# Patient Record
Sex: Male | Born: 2000 | Hispanic: No | Marital: Single | Smoking: Never smoker
Health system: Southern US, Community
[De-identification: ages and names within clinical notes are randomized; demographics above are authoritative.]

## PROBLEM LIST (undated history)

## (undated) DIAGNOSIS — J302 Other seasonal allergic rhinitis: Secondary | ICD-10-CM

## (undated) HISTORY — PX: NO PAST SURGERIES: SHX2092

## (undated) HISTORY — DX: Other seasonal allergic rhinitis: J30.2

---
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
## 2018-05-02 ENCOUNTER — Ambulatory Visit (HOSPITAL_COMMUNITY)
Admission: EM | Admit: 2018-05-02 | Discharge: 2018-05-02 | Disposition: A | Discharge status: Home / Self Care | Payer: Medicaid Other | Attending: Family Medicine | Admitting: Family Medicine

## 2018-05-02 ENCOUNTER — Encounter (HOSPITAL_COMMUNITY): Payer: Self-pay | Admitting: Emergency Medicine

## 2018-05-02 DIAGNOSIS — R21 Rash and other nonspecific skin eruption: Secondary | ICD-10-CM | POA: Diagnosis not present

## 2018-05-02 MED ORDER — PIMECROLIMUS 1 % EX CREA: TOPICAL_CREAM | Freq: Two times a day (BID) | CUTANEOUS | 0 refills | Status: DC

## 2018-05-02 NOTE — ED Triage Notes (Signed)
Pt c/o L ear pain x2 weeks.

## 2018-05-02 NOTE — Discharge Instructions (Signed)
Please continue to apply elidel cream twice daily Continue nystatin cream twice daily Keep area dry after showering  Follow up in 2 weeks if symptoms persisting, not improving, worsening, developing pain

## 2018-05-02 NOTE — ED Provider Notes (Signed)
[facility]    CSN: [id number] Arrival date & time: 05/02/18  1644     History   Chief Complaint Chief Complaint  Patient presents with  . Otalgia    HPI Micheal Bentley is a 17 y.o. male history of asthma and allergic rhinitis presenting today for evaluation of ear itching.  Patient states that over the past 2 weeks he has had itching to his left ear.  He was seen previously and prescribed nystatin, terbinafine, triamcinolone cream which she has been applying which she is not felt has helped.  He has had continued itching and some slight discoloration near his ear.  He denies any pain or pain inside the ear.  Denies any drainage.  Denies fevers, sore throat.  He began using Elidel 2 days ago which he had from a previous prescription from a dermatologist, since he has been using this he feels his symptoms have slightly improved with using this.  He denies history of eczema or psoriasis.  HPI  Past Medical History:  Diagnosis Date  . Asthma    Not an active issue  . Seasonal allergies     There are no active problems to display for this patient.   History reviewed. No pertinent surgical history.     Home Medications    Prior to Admission medications   Medication Sig Start Date End Date Taking? Authorizing Provider  nystatin cream (MYCOSTATIN) Apply 1 application topically 2 (two) times daily.   Yes [provider]  terbinafine (LAMISIL) 250 MG tablet Take 250 mg by mouth daily.   Yes [provider]  triamcinolone cream (KENALOG) 0.5 % Apply 1 application topically 3 (three) times daily.   Yes [provider]  ibuprofen (ADVIL,MOTRIN) 600 MG tablet Take 1 tablet (600 mg total) by mouth every 6 (six) hours as needed for mild pain or moderate pain. 02/05/15   Marcellina MillinGaley, Timothy, MD  loratadine (CLARITIN) 10 MG tablet Take 10 mg by mouth daily.    [provider]  pimecrolimus (ELIDEL) 1 % cream Apply topically 2 (two) times daily.  05/02/18   Myisha Pickerel, Junius CreamerHallie C, PA-C    Family History Family History  Problem Relation Age of Onset  . Diabetes Father     Social History Social History   Tobacco Use  . Smoking status: Never Smoker  Substance Use Topics  . Alcohol use: No  . Drug use: No     Allergies   Patient has no known allergies.   Review of Systems Review of Systems  Constitutional: Negative for fatigue and fever.  HENT: Negative for congestion, ear discharge, ear pain and sore throat.   Eyes: Negative for redness, itching and visual disturbance.  Respiratory: Negative for shortness of breath.   Cardiovascular: Negative for chest pain and leg swelling.  Gastrointestinal: Negative for nausea and vomiting.  Musculoskeletal: Negative for arthralgias and myalgias.  Skin: Positive for color change and rash. Negative for wound.  Neurological: Negative for dizziness, syncope, weakness, light-headedness and headaches.     Physical Exam Triage Vital Signs ED Triage Vitals [05/02/18 1707]  Enc Vitals Group     BP 117/64     Pulse Rate 64     Resp 16     Temp 98.2 F (36.8 C)     Temp src      SpO2 100 %     Weight      Height      Head Circumference  Peak Flow      Pain Score      Pain Loc      Pain Edu?      Excl. in GC?    No data found.  Updated Vital Signs BP 117/64   Pulse 64   Temp 98.2 F (36.8 C)   Resp 16   SpO2 100%   Visual Acuity Right Eye Distance:   Left Eye Distance:   Bilateral Distance:    Right Eye Near:   Left Eye Near:    Bilateral Near:     Physical Exam  Constitutional: He appears well-developed and well-nourished.  HENT:  Head: Normocephalic and atraumatic.  Left ear with slight hypopigmentation to anterior aspect near tragus, slight yellow crusting, external auricle and tragus nontender to palpation, EAC and TM without erythema; no involvement of right ear  Oral mucosa pink and moist, no tonsillar enlargement or exudate. Posterior pharynx patent  and nonerythematous, no uvula deviation or swelling. Normal phonation.  Eyes: Conjunctivae are normal.  Neck: Neck supple.  Cardiovascular: Normal rate and regular rhythm.  No murmur heard. Pulmonary/Chest: Effort normal and breath sounds normal. No respiratory distress.  Abdominal: Soft. There is no tenderness.  Musculoskeletal: He exhibits no edema.  Neurological: He is alert.  Skin: Skin is warm and dry.  No rash elsewhere on skin visualized  Psychiatric: He has a normal mood and affect.  Nursing note and vitals reviewed.    UC Treatments / Results  Labs (all labs ordered are listed, but only abnormal results are displayed) Labs Reviewed - No data to display  EKG None  Radiology No results found.  Procedures Procedures (including critical care time)  Medications Ordered in UC Medications - No data to display  Initial Impression / Assessment and Plan / UC Course  I have reviewed the triage vital signs and the nursing notes.  Pertinent labs & imaging results that were available during my care of the patient were reviewed by me and considered in my medical decision making (see chart for details).     Hypopigmentation possibly from use of triamcinolone cream versus other inflammatory rash versus fungal infection.  Given patient has had improvement of symptoms with Elidel, will refill this and have patient continue this as well as nystatin and stop triamcinolone. Discussed keeping area dry.  Follow-up in 2 weeks if not improving, or sooner if worsening.Discussed strict return precautions. Patient verbalized understanding and is agreeable with plan.  Final Clinical Impressions(s) / UC Diagnoses   Final diagnoses:  Rash and nonspecific skin eruption     Discharge Instructions     Please continue to apply elidel cream twice daily Continue nystatin cream twice daily Keep area dry after showering  Follow up in 2 weeks if symptoms persisting, not improving, worsening,  developing pain    ED Prescriptions    Medication Sig Dispense Auth. Provider   pimecrolimus (ELIDEL) 1 % cream Apply topically 2 (two) times daily. 100 g Deseri Loss, Marmarth C, PA-C     Controlled Substance Prescriptions [place] Controlled Substance Registry consulted? Not Applicable   Lew Dawes, [place] 05/02/18 1830

## 2019-04-28 ENCOUNTER — Emergency Department (HOSPITAL_COMMUNITY)
Admission: EM | Admit: 2019-04-28 | Discharge: 2019-04-28 | Disposition: A | Discharge status: Home / Self Care | Payer: Medicaid Other | Attending: Emergency Medicine | Admitting: Emergency Medicine

## 2019-04-28 ENCOUNTER — Emergency Department (HOSPITAL_COMMUNITY): Payer: Medicaid Other

## 2019-04-28 ENCOUNTER — Encounter (HOSPITAL_COMMUNITY): Payer: Self-pay

## 2019-04-28 ENCOUNTER — Other Ambulatory Visit: Payer: Self-pay

## 2019-04-28 DIAGNOSIS — Z79899 Other long term (current) drug therapy: Secondary | ICD-10-CM | POA: Diagnosis not present

## 2019-04-28 DIAGNOSIS — S80911A Unspecified superficial injury of right knee, initial encounter: Secondary | ICD-10-CM | POA: Insufficient documentation

## 2019-04-28 DIAGNOSIS — X500XXA Overexertion from strenuous movement or load, initial encounter: Secondary | ICD-10-CM | POA: Insufficient documentation

## 2019-04-28 DIAGNOSIS — J45909 Unspecified asthma, uncomplicated: Secondary | ICD-10-CM | POA: Diagnosis not present

## 2019-04-28 DIAGNOSIS — Y999 Unspecified external cause status: Secondary | ICD-10-CM | POA: Insufficient documentation

## 2019-04-28 DIAGNOSIS — Y9366 Activity, soccer: Secondary | ICD-10-CM | POA: Insufficient documentation

## 2019-04-28 DIAGNOSIS — Y929 Unspecified place or not applicable: Secondary | ICD-10-CM | POA: Insufficient documentation

## 2019-04-28 DIAGNOSIS — S8991XA Unspecified injury of right lower leg, initial encounter: Secondary | ICD-10-CM

## 2019-04-28 MED ORDER — IBUPROFEN 800 MG PO TABS: 800.0000 mg | ORAL_TABLET | Freq: Three times a day (TID) | ORAL | 0 refills | Status: DC | PRN

## 2019-04-28 NOTE — Discharge Instructions (Signed)
Follow-up with the orthopedist provided.  Return here for any worsening in your condition.  Ice and elevate your knee.

## 2019-04-28 NOTE — ED Triage Notes (Signed)
Patient states he was playing soccer yesterday and felt something pop in his right knee.

## 2019-04-28 NOTE — ED Notes (Signed)
An After Visit Summary was printed and given to the patient. Discharge instructions given and no further questions at this time.  

## 2019-05-02 ENCOUNTER — Ambulatory Visit (INDEPENDENT_AMBULATORY_CARE_PROVIDER_SITE_OTHER): Payer: Medicaid Other | Admitting: Orthopaedic Surgery

## 2019-05-02 ENCOUNTER — Encounter: Payer: Self-pay | Admitting: Orthopaedic Surgery

## 2019-05-02 DIAGNOSIS — M25561 Pain in right knee: Secondary | ICD-10-CM | POA: Diagnosis not present

## 2019-05-02 DIAGNOSIS — M2391 Unspecified internal derangement of right knee: Secondary | ICD-10-CM

## 2019-05-02 NOTE — Addendum Note (Signed)
Addended by: Meyer Cory on: 05/02/2019 11:16 AM   Modules accepted: Orders

## 2019-05-02 NOTE — Progress Notes (Signed)
Office Visit Note   Patient: Micheal Bentley           Date of Birth: 11/18/2000           MRN: [id number] Visit Date: 05/02/2019              Requested by: [facility] [address] [place] [id number] PCP: [facility]   Assessment & Plan: Visit Diagnoses:  1. Locking of right knee     Plan: Third episode of significant right knee pain swelling and inability to ambulate since December 2019.  Other times he has been able play soccer without problems.  He may either have medial meniscal tear versus medial meniscal tear with partial ACL tear.  I recommend proceeding with an MRI scan since he is not able to ambulate and has significant knee effusion with recurrent episodes now x8410months right knee.  Office follow-up after MRI scan.  Follow-Up Instructions: followup after MRI Right  knee  Orders:  No orders of the defined types were placed in this encounter.  No orders of the defined types were placed in this encounter.     Procedures: No procedures performed   Clinical Data: No additional findings.   Subjective: Chief Complaint  Patient presents with   Right Knee - Pain    DOI 04/27/2019    HPI 18 year old male was playing soccer and ran to stop felt a sharp pop in his knee and was unable to stand had to hop up the field he was not able to weight-bear.  He is noticed swelling in his knee with effusion.  Past history of injury to his knee in December 2019 playing soccer when his knee was straight planted on the ground and he rotated with sharp pain and pop in his knee.  He states the episode on 04/27/2019 was much worse than the initial injury.  He is on crutches has a knee immobilizer and has significant knee effusion noted today.  Plain radiographs in the ER 04/28/2019 were negative for fracture.  Patient has been on ibuprofen 800 mg with relief.  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  Review of Systems patient been active healthy no surgeries or significant serious illnesses.   Objective: Vital Signs: BP 127/73    Pulse 70    Ht 5\' 8"  (1.727 m)    Wt 175 lb (79.4 kg)    BMI 26.61 kg/m   Physical Exam Constitutional:      Appearance: He is well-developed.  HENT:     Head: Normocephalic and atraumatic.  Eyes:     Pupils: Pupils are equal, round, and reactive to light.  Neck:     Thyroid: No thyromegaly.     Trachea: No tracheal deviation.  Cardiovascular:     Rate and Rhythm: Normal rate.  Pulmonary:     Effort: Pulmonary effort is normal.     Breath sounds: No wheezing.  Abdominal:     General: Bowel sounds are normal.     Palpations: Abdomen is soft.  Skin:    General: Skin is warm and dry.     Capillary Refill: Capillary refill takes less than 2 seconds.  Neurological:     Mental Status: He is alert and oriented to person, place, and time.  Psychiatric:        Behavior: Behavior normal.        Thought Content: Thought content  normal.        Judgment: Judgment normal.     Ortho Exam patient has 3+ knee effusion.  Pain with valgus testing pain along the medial joint line.  Distal pulses are intact negative logroll to the hips.  No lateral joint line tenderness.  Specialty Comments:  No specialty comments available.  Imaging: No results found.   PMFS History: Patient Active Problem List   Diagnosis Date Noted   Locking of right knee 05/02/2019   Past Medical History:  Diagnosis Date   Asthma    Not an active issue   Seasonal allergies     Family History  Problem Relation Age of Onset   Diabetes Father     No past surgical history on file. Social History   Occupational History   Not on file  Tobacco Use   Smoking status: Never Smoker   Smokeless tobacco: Never Used  Substance and Sexual Activity   Alcohol use: No   Drug use: No   Sexual activity: Not on file

## 2019-05-04 NOTE — ED Provider Notes (Signed)
[facility] DEPT Provider Note   CSN: [id number] Arrival date & time: 04/28/19  0945     History   Chief Complaint Chief Complaint  Patient presents with  . Knee Injury    HPI Micheal Bentley is a 18 y.o. male.     HPI Patient presents to the emergency department with right knee injury that occurred while playing soccer yesterday.  The patient states that he felt a pop in the knee.  He states his pain is mainly across the medial aspect of the knee.  States that he has had previous injury to the kneecap on that knee as well.  The patient states that he is unable to bear weight on the knee.  Patient states he did not take any medications prior to arrival for symptoms. Past Medical History:  Diagnosis Date  . Asthma    Not an active issue  . Seasonal allergies     Patient Active Problem List   Diagnosis Date Noted  . Locking of right knee 05/02/2019    History reviewed. No pertinent surgical history.      Home Medications    Prior to Admission medications   Medication Sig Start Date End Date Taking? Authorizing Provider  ibuprofen (ADVIL) 800 MG tablet Take 1 tablet (800 mg total) by mouth every 8 (eight) hours as needed. 04/28/19   Aerica Rincon, Harrell Gave, PA-C  loratadine (CLARITIN) 10 MG tablet Take 10 mg by mouth daily.    [provider]  nystatin cream (MYCOSTATIN) Apply 1 application topically 2 (two) times daily.    [provider]  pimecrolimus (ELIDEL) 1 % cream Apply topically 2 (two) times daily. 05/02/18   Wieters, Hallie C, PA-C  terbinafine (LAMISIL) 250 MG tablet Take 250 mg by mouth daily.    [provider]  triamcinolone cream (KENALOG) 0.5 % Apply 1 application topically 3 (three) times daily.    [provider]    Family History Family History  Problem Relation Age of Onset  . Diabetes Father     Social History Social History   Tobacco Use  . Smoking status: Never Smoker  .  Smokeless tobacco: Never Used  Substance Use Topics  . Alcohol use: No  . Drug use: No     Allergies   Patient has no known allergies.   Review of Systems Review of Systems All other systems negative except as documented in the HPI. All pertinent positives and negatives as reviewed in the HPI.  Physical Exam Updated Vital Signs BP 107/63 (BP Location: Left Arm)   Pulse 82   Temp 98.1 F (36.7 C) (Oral)   Resp 14   Ht 5\' 8"  (1.727 m)   Wt 79.8 kg   SpO2 97%   BMI 26.76 kg/m   Physical Exam Vitals signs and nursing note reviewed.  Constitutional:      General: He is not in acute distress.    Appearance: He is well-developed.  HENT:     Head: Normocephalic and atraumatic.  Eyes:     Pupils: Pupils are equal, round, and reactive to light.  Pulmonary:     Effort: Pulmonary effort is normal.  Musculoskeletal:     Right knee: He exhibits decreased range of motion. He exhibits no swelling, no deformity, no laceration and no erythema. Tenderness found. Medial joint line and MCL tenderness noted.  Skin:    General: Skin is warm and dry.  Neurological:     Mental Status: He is  alert and oriented to person, place, and time.      ED Treatments / Results  Labs (all labs ordered are listed, but only abnormal results are displayed) Labs Reviewed - No data to display  EKG None  Radiology No results found.  Procedures Procedures (including critical care time)  Medications Ordered in ED Medications - No data to display   Initial Impression / Assessment and Plan / ED Course  I have reviewed the triage vital signs and the nursing notes.  Pertinent labs & imaging results that were available during my care of the patient were reviewed by me and considered in my medical decision making (see chart for details).    Patient will be placed in a knee immobilizer with crutches.  He will be referred to orthopedics for further evaluation and care.  Have advised him to ice and  elevate the knee.     Final Clinical Impressions(s) / ED Diagnoses   Final diagnoses:  Injury of right knee, initial encounter    ED Discharge Orders         Ordered    ibuprofen (ADVIL) 800 MG tablet  Every 8 hours PRN     04/28/19 1248           Charlestine NightLawyer, Quran Vasco, PA-C 05/04/19 1537    Little, Ambrose Finlandachel Morgan, MD 05/05/19 1003

## 2019-05-13 ENCOUNTER — Other Ambulatory Visit: Payer: Self-pay

## 2019-05-13 ENCOUNTER — Ambulatory Visit
Admission: RE | Admit: 2019-05-13 | Discharge: 2019-05-13 | Disposition: A | Discharge status: Home / Self Care | Payer: Medicaid Other | Source: Ambulatory Visit | Attending: Orthopaedic Surgery | Admitting: Orthopaedic Surgery

## 2019-05-13 DIAGNOSIS — M2391 Unspecified internal derangement of right knee: Secondary | ICD-10-CM

## 2019-05-13 DIAGNOSIS — M25561 Pain in right knee: Secondary | ICD-10-CM

## 2019-05-17 ENCOUNTER — Ambulatory Visit (INDEPENDENT_AMBULATORY_CARE_PROVIDER_SITE_OTHER): Payer: Medicaid Other | Admitting: Surgery

## 2019-05-17 DIAGNOSIS — M2391 Unspecified internal derangement of right knee: Secondary | ICD-10-CM

## 2019-05-17 DIAGNOSIS — S83511A Sprain of anterior cruciate ligament of right knee, initial encounter: Secondary | ICD-10-CM

## 2019-05-17 DIAGNOSIS — M25361 Other instability, right knee: Secondary | ICD-10-CM

## 2019-05-17 NOTE — Progress Notes (Signed)
   Office Visit Note   Patient: Micheal Bentley           Date of Birth: 2001-01-06           MRN: [id number] Visit Date: 05/17/2019              Requested by: [facility] [place],  [place] [address] PCP: [facility]   Assessment & Plan: Visit Diagnoses:  1. Rupture of anterior cruciate ligament of right knee, initial encounter   2. Knee instability, right   3. Locking of right knee     Plan: Patient will follow-up in 1 week with Dr. Marlou Sa again to review right knee MRI scan and discuss likely surgical options.  Follow-Up Instructions: Return in about 1 week (around 05/24/2019) for with dr dean for recheck right knee.   Orders:  No orders of the defined types were placed in this encounter.  No orders of the defined types were placed in this encounter.     Procedures: No procedures performed   Clinical Data: No additional findings.   Subjective: Chief Complaint  Patient presents with   Right Knee - Follow-up    HPI  Review of Systems   Objective: Vital Signs: There were no vitals taken for this visit.  Physical Exam  Ortho Exam  Specialty Comments:  No specialty comments available.  Imaging: No results found.   PMFS History: Patient Active Problem List   Diagnosis Date Noted   Locking of right knee 05/02/2019   Past Medical History:  Diagnosis Date   Asthma    Not an active issue   Seasonal allergies     Family History  Problem Relation Age of Onset   Diabetes Father     No past surgical history on file. Social History   Occupational History   Not on file  Tobacco Use   Smoking status: Never Smoker   Smokeless tobacco: Never Used  Substance and Sexual Activity   Alcohol use: No   Drug use: No   Sexual activity: Not on file

## 2019-05-24 ENCOUNTER — Ambulatory Visit (INDEPENDENT_AMBULATORY_CARE_PROVIDER_SITE_OTHER): Payer: Medicaid Other | Admitting: Orthopedic Surgery

## 2019-05-24 DIAGNOSIS — S83511A Sprain of anterior cruciate ligament of right knee, initial encounter: Secondary | ICD-10-CM

## 2019-05-25 ENCOUNTER — Other Ambulatory Visit (HOSPITAL_COMMUNITY)
Admission: RE | Admit: 2019-05-25 | Discharge: 2019-05-25 | Disposition: A | Discharge status: Home / Self Care | Payer: Medicaid Other | Source: Ambulatory Visit | Attending: Orthopedic Surgery | Admitting: Orthopedic Surgery

## 2019-05-25 ENCOUNTER — Encounter (HOSPITAL_COMMUNITY): Payer: Self-pay | Admitting: *Deleted

## 2019-05-25 ENCOUNTER — Other Ambulatory Visit: Payer: Self-pay

## 2019-05-25 DIAGNOSIS — Z20828 Contact with and (suspected) exposure to other viral communicable diseases: Secondary | ICD-10-CM | POA: Diagnosis not present

## 2019-05-25 DIAGNOSIS — Z01812 Encounter for preprocedural laboratory examination: Secondary | ICD-10-CM | POA: Diagnosis present

## 2019-05-25 NOTE — Pre-Procedure Instructions (Signed)
EMMANUELLE COXE  05/25/2019      Your procedure is scheduled on Monday, October 12.  Report to [facility], Main Entrance or Entrance "A" at 7:30 AM A  Call this number if you have problems the morning of surgery: [phone]  This is the number for the Pre- Surgical Desk.    Remember:  Do not eat  after midnight Sunday, October 11.  You may drink clear liquids until 7:00 AM .  Clear liquids allowed are:                    Water, Juice (non-citric and without pulp), Carbonated beverages, Clear Tea, Black Coffee only, Plain Jell-O only, Gatorade and Plain Popsicles only  Drink the Pre- Surgery Ensure between 6:30 AM and 7:00 AM. Do not drink anything after 7:00 AM    [facility]- Preparing For Surgery  Before surgery, you can play an important role. Because skin is not sterile, your skin needs to be as free of germs as possible. You can reduce the number of germs on your skin by washing with CHG (chlorahexidine gluconate) Soap before surgery.  CHG is an antiseptic cleaner which kills germs and bonds with the skin to continue killing germs even after washing.    Oral Hygiene is also important to reduce your risk of infection.  Remember - BRUSH YOUR TEETH THE MORNING OF SURGERY WITH YOUR REGULAR TOOTHPASTE  Please do not use if you have an allergy to CHG or antibacterial soaps. If your skin becomes reddened/irritated stop using the CHG.  Do not shave (including legs and underarms) for at least 48 hours prior to first CHG shower. It is OK to shave your face.  Please follow these instructions carefully.   1. Shower the NIGHT BEFORE SURGERY and the MORNING OF SURGERY with CHG.   2. If you chose to wash your hair, wash your hair first as usual with your normal shampoo.  After you shampoo, wash your face and private area with the soap you use at home, then rinse your hair and body thoroughly to remove the shampoo and soap.   3. Use CHG as you would any other liquid soap.  You can apply CHG directly to the skin and wash gently with a scrungie or a clean washcloth.   Apply the CHG Soap to your body ONLY FROM THE NECK DOWN.  Do not use on open wounds or open sores. Avoid contact with your eyes, ears, mouth and genitals (private parts). 4. Wash thoroughly, paying special attention to the area where your surgery will be performed.  5. Thoroughly rinse your body with warm water from the neck down.  6. DO NOT shower/wash with your normal soap after using and rinsing off the CHG Soap.  7. Pat yourself dry with a CLEAN TOWEL.  8. Wear CLEAN PAJAMAS to bed the night before surgery, wear comfortable clothes the morning of surgery  9. Place CLEAN SHEETS on your bed the night of your first shower and DO NOT SLEEP WITH PETS.  Day of Surgery: Shower as instructed above. Do not apply any deodorants/lotions, powders or colognes. Please wear clean clothes to the hospital/surgery center.   Remember to brush your teeth WITH YOUR REGULAR TOOTHPASTE.   Do not wear jewelry, make-up or nail polish.   Men may shave face and neck.  Do not bring valuables to the hospital.  [facility] is not responsible for any belongings or valuables.  Patients discharged the day of surgery will not be allowed to drive home.

## 2019-05-25 NOTE — Progress Notes (Signed)
Mr. Micheal Bentley denies chest pain or shortness of breath. Patient denies any s/s of covid 19 for himself or anyone he lives with. Patient is going to be tested today and will quarantine at home with family.  Patient voiced understanding. Mr Paulos is going to come to [facility] and pick up the Ensure- Pre- Surgery.

## 2019-05-26 ENCOUNTER — Encounter: Payer: Self-pay | Admitting: Orthopedic Surgery

## 2019-05-26 LAB — NOVEL CORONAVIRUS, NAA (HOSP ORDER, SEND-OUT TO REF LAB; TAT 18-24 HRS): SARS-CoV-2, NAA: NOT DETECTED

## 2019-05-26 NOTE — Progress Notes (Signed)
Office Visit Note   Patient: Micheal Bentley           Date of Birth: 06-17-2001           MRN: [id number] Visit Date: 05/24/2019 Requested by: [facility] [place] [place],  [place] [address] PCP: [facility]  Subjective: Chief Complaint  Patient presents with  . Right Knee - Follow-up    HPI: Micheal Bentley is a patient with right knee pain.  MRI scan shows ACL tear medial meniscal tear as well as a possible chondral defect on the medial condyle.  He has done a good job of achieving good range of motion.  He wants to set up surgical intervention.  No personal or family history of DVT or pulmonary embolism.              ROS: All systems reviewed are negative as they relate to the chief complaint within the history of present illness.  Patient denies  fevers or chills.   Assessment & Plan: Visit Diagnoses:  1. Rupture of anterior cruciate ligament of right knee, initial encounter     Plan: Impression is ACL tear medial meniscal tear and chondral defect which looks to be about a centimeter in diameter on that posterior medial femoral condyle.  Plan is ACL reconstruction with hamstring autograft along with meniscal debridement and/or repair and also microfracture and bio cartilage implantation into that defect.  The risk benefits are discussed with [name] including infection nerve vessel damage knee stiffness incomplete pain relief as well as development of arthritis in that knee based on presence of meniscal damage and chondral damage in that medial compartment.  Nonetheless plan is for least a 9 mm graft in this patient in order to have robust tissue present.  Patient understands risk and benefits.  All questions answered.  Plan CPM machine use after surgery as well.  Follow-Up Instructions: No follow-ups on file.   Orders:  No orders of the defined types were placed in this encounter.  No orders of the defined types were  placed in this encounter.     Procedures: No procedures performed   Clinical Data: No additional findings.  Objective: Vital Signs: There were no vitals taken for this visit.  Physical Exam:   Constitutional: Patient appears well-developed HEENT:  Head: Normocephalic Eyes:EOM are normal Neck: Normal range of motion Cardiovascular: Normal rate Pulmonary/chest: Effort normal Neurologic: Patient is alert Skin: Skin is warm Psychiatric: Patient has normal mood and affect    Ortho Exam: Ortho exam demonstrates mild effusion with stable collateral ligaments.  No posterior lateral rotatory instability is noted.  PCL intact ACL is out.  Medial joint line tenderness is present.  Extensor mechanism is intact  Specialty Comments:  No specialty comments available.  Imaging: No results found.   PMFS History: Patient Active Problem List   Diagnosis Date Noted  . 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 Asthma    Not an active issue  . Seasonal allergies     Family History  Problem Relation Age of Onset  . Diabetes Father     Past Surgical History:  Procedure Laterality Date  . NO PAST SURGERIES     Social History   Occupational History  . Not on file  Tobacco Use  . Smoking status: Never Smoker  . Smokeless tobacco: Never Used  Substance and Sexual Activity  . Alcohol  use: No  . Drug use: No  . Sexual activity: Not on file

## 2019-05-28 ENCOUNTER — Encounter (HOSPITAL_COMMUNITY): Payer: Self-pay | Admitting: Certified Registered"

## 2019-05-29 ENCOUNTER — Ambulatory Visit (HOSPITAL_COMMUNITY): Payer: Medicaid Other | Admitting: Certified Registered"

## 2019-05-29 ENCOUNTER — Ambulatory Visit (HOSPITAL_COMMUNITY)
Admission: RE | Admit: 2019-05-29 | Discharge: 2019-05-29 | Disposition: A | Discharge status: Home / Self Care | Payer: Medicaid Other | Attending: Orthopedic Surgery | Admitting: Orthopedic Surgery

## 2019-05-29 ENCOUNTER — Encounter: Payer: Self-pay | Admitting: Orthopedic Surgery

## 2019-05-29 ENCOUNTER — Encounter (HOSPITAL_COMMUNITY): Payer: Self-pay | Admitting: Anesthesiology

## 2019-05-29 ENCOUNTER — Encounter (HOSPITAL_COMMUNITY)
Admission: RE | Disposition: A | Discharge status: Home / Self Care | Payer: Self-pay | Source: Home / Self Care | Attending: Orthopedic Surgery

## 2019-05-29 DIAGNOSIS — S83511D Sprain of anterior cruciate ligament of right knee, subsequent encounter: Secondary | ICD-10-CM

## 2019-05-29 DIAGNOSIS — S83241D Other tear of medial meniscus, current injury, right knee, subsequent encounter: Secondary | ICD-10-CM

## 2019-05-29 DIAGNOSIS — S83511A Sprain of anterior cruciate ligament of right knee, initial encounter: Secondary | ICD-10-CM | POA: Diagnosis not present

## 2019-05-29 DIAGNOSIS — Y9366 Activity, soccer: Secondary | ICD-10-CM | POA: Insufficient documentation

## 2019-05-29 DIAGNOSIS — X500XXA Overexertion from strenuous movement or load, initial encounter: Secondary | ICD-10-CM | POA: Diagnosis not present

## 2019-05-29 DIAGNOSIS — J45909 Unspecified asthma, uncomplicated: Secondary | ICD-10-CM | POA: Insufficient documentation

## 2019-05-29 DIAGNOSIS — S83241A Other tear of medial meniscus, current injury, right knee, initial encounter: Secondary | ICD-10-CM | POA: Diagnosis present

## 2019-05-29 HISTORY — PX: ANTERIOR CRUCIATE LIGAMENT REPAIR: SHX115

## 2019-05-29 LAB — BASIC METABOLIC PANEL
Anion gap: 8 (ref 5–15)
BUN: 10 mg/dL (ref 6–20)
CO2: 26 mmol/L (ref 22–32)
Calcium: 9.2 mg/dL (ref 8.9–10.3)
Chloride: 105 mmol/L (ref 98–111)
Creatinine, Ser: 0.98 mg/dL (ref 0.61–1.24)
GFR calc Af Amer: >60 mL/min (ref >60)
GFR calc non Af Amer: >60 mL/min (ref >60)
Glucose, Bld: 94 mg/dL (ref 70–99)
Potassium: 3.9 mmol/L (ref 3.5–5.1)
Sodium: 139 mmol/L (ref 135–145)

## 2019-05-29 LAB — CBC
HCT: 46.8 % (ref 39.0–52.0)
Hemoglobin: 15.7 g/dL (ref 13.0–17.0)
MCH: 30.5 pg (ref 26.0–34.0)
MCHC: 33.5 g/dL (ref 30.0–36.0)
MCV: 90.9 fL (ref 80.0–100.0)
Platelets: 145 10*3/uL — ABNORMAL LOW (ref 150–400)
RBC: 5.15 MIL/uL (ref 4.22–5.81)
RDW: 12.4 % (ref 11.5–15.5)
WBC: 5.2 10*3/uL (ref 4.0–10.5)
nRBC: 0 % (ref 0.0–0.2)

## 2019-05-29 SURGERY — RECONSTRUCTION, KNEE, ACL, USING HAMSTRING GRAFT
Anesthesia: General | Site: Knee | Laterality: Right

## 2019-05-29 MED ORDER — EPINEPHRINE PF 1 MG/ML IJ SOLN
INTRAMUSCULAR | Status: AC
  Filled 2019-05-29: qty 4

## 2019-05-29 MED ORDER — LACTATED RINGERS IV SOLN
INTRAVENOUS | Status: DC
  Administered 2019-05-29 (×3): via INTRAVENOUS

## 2019-05-29 MED ORDER — CLONIDINE HCL (ANALGESIA) 100 MCG/ML EP SOLN
EPIDURAL | Status: DC | PRN
  Administered 2019-05-29: 1 mL via INTRA_ARTICULAR

## 2019-05-29 MED ORDER — FENTANYL CITRATE (PF) 100 MCG/2ML IJ SOLN
INTRAMUSCULAR | Status: DC | PRN
  Administered 2019-05-29 (×2): 50 ug via INTRAVENOUS

## 2019-05-29 MED ORDER — BUPIVACAINE HCL (PF) 0.25 % IJ SOLN
INTRAMUSCULAR | Status: AC
  Filled 2019-05-29: qty 30

## 2019-05-29 MED ORDER — SODIUM CHLORIDE 0.9 % IR SOLN
Status: DC | PRN
  Administered 2019-05-29 (×10): 3000 mL

## 2019-05-29 MED ORDER — FENTANYL CITRATE (PF) 100 MCG/2ML IJ SOLN
INTRAMUSCULAR | Status: AC
  Administered 2019-05-29: 09:00:00 50 ug via INTRAVENOUS
  Filled 2019-05-29: qty 2

## 2019-05-29 MED ORDER — EPHEDRINE 5 MG/ML INJ
INTRAVENOUS | Status: AC
  Filled 2019-05-29: qty 10

## 2019-05-29 MED ORDER — EPHEDRINE SULFATE-NACL 50-0.9 MG/10ML-% IV SOSY
PREFILLED_SYRINGE | INTRAVENOUS | Status: DC | PRN
  Administered 2019-05-29 (×2): 5 mg via INTRAVENOUS

## 2019-05-29 MED ORDER — MIDAZOLAM HCL 2 MG/2ML IJ SOLN
INTRAMUSCULAR | Status: AC
  Filled 2019-05-29: qty 2

## 2019-05-29 MED ORDER — LIDOCAINE 2% (20 MG/ML) 5 ML SYRINGE
INTRAMUSCULAR | Status: DC | PRN
  Administered 2019-05-29: 50 mg via INTRAVENOUS

## 2019-05-29 MED ORDER — OXYCODONE HCL 5 MG PO TABS
ORAL_TABLET | ORAL | Status: AC
  Filled 2019-05-29: qty 1

## 2019-05-29 MED ORDER — 0.9 % SODIUM CHLORIDE (POUR BTL) OPTIME
TOPICAL | Status: DC | PRN
  Administered 2019-05-29: 11:00:00 1000 mL

## 2019-05-29 MED ORDER — CEFAZOLIN SODIUM-DEXTROSE 2-4 GM/100ML-% IV SOLN
2.0000 g | INTRAVENOUS | Status: AC
  Administered 2019-05-29: 2 g via INTRAVENOUS
  Filled 2019-05-29: qty 100

## 2019-05-29 MED ORDER — FENTANYL CITRATE (PF) 100 MCG/2ML IJ SOLN
50.0000 ug | Freq: Once | INTRAMUSCULAR | Status: AC
  Administered 2019-05-29: 09:00:00 50 ug via INTRAVENOUS

## 2019-05-29 MED ORDER — DEXAMETHASONE SODIUM PHOSPHATE 10 MG/ML IJ SOLN
INTRAMUSCULAR | Status: DC | PRN
  Administered 2019-05-29: 10 mg via INTRAVENOUS

## 2019-05-29 MED ORDER — CHLORHEXIDINE GLUCONATE 4 % EX LIQD: 60.0000 mL | Freq: Once | CUTANEOUS | Status: DC

## 2019-05-29 MED ORDER — MORPHINE SULFATE (PF) 4 MG/ML IV SOLN
INTRAVENOUS | Status: AC
  Filled 2019-05-29: qty 2

## 2019-05-29 MED ORDER — BUPIVACAINE-EPINEPHRINE (PF) 0.5% -1:200000 IJ SOLN
INTRAMUSCULAR | Status: DC | PRN
  Administered 2019-05-29: 30 mL via PERINEURAL

## 2019-05-29 MED ORDER — SODIUM CHLORIDE 0.9 % IR SOLN
Status: DC | PRN
  Administered 2019-05-29 (×4): 1 mL

## 2019-05-29 MED ORDER — ASPIRIN EC 81 MG PO TBEC: 81.0000 mg | DELAYED_RELEASE_TABLET | Freq: Every day | ORAL | 0 refills | Status: DC

## 2019-05-29 MED ORDER — FENTANYL CITRATE (PF) 250 MCG/5ML IJ SOLN
INTRAMUSCULAR | Status: AC
  Filled 2019-05-29: qty 5

## 2019-05-29 MED ORDER — BUPIVACAINE-EPINEPHRINE 0.25% -1:200000 IJ SOLN
INTRAMUSCULAR | Status: DC | PRN
  Administered 2019-05-29: 30 mL
  Administered 2019-05-29: 17 mL

## 2019-05-29 MED ORDER — PHENYLEPHRINE HCL (PRESSORS) 10 MG/ML IV SOLN
INTRAVENOUS | Status: DC | PRN
  Administered 2019-05-29 (×5): 80 ug via INTRAVENOUS

## 2019-05-29 MED ORDER — CLONIDINE HCL (ANALGESIA) 100 MCG/ML EP SOLN
EPIDURAL | Status: AC
  Filled 2019-05-29: qty 10

## 2019-05-29 MED ORDER — POVIDONE-IODINE 10 % EX SWAB: 2.0000 "application " | Freq: Once | CUTANEOUS | Status: DC

## 2019-05-29 MED ORDER — PROPOFOL 10 MG/ML IV BOLUS
INTRAVENOUS | Status: AC
  Filled 2019-05-29: qty 20

## 2019-05-29 MED ORDER — MORPHINE SULFATE (PF) 4 MG/ML IV SOLN
INTRAVENOUS | Status: DC | PRN
  Administered 2019-05-29: 8 mg

## 2019-05-29 MED ORDER — ONDANSETRON HCL 4 MG/2ML IJ SOLN
INTRAMUSCULAR | Status: DC | PRN
  Administered 2019-05-29: 4 mg via INTRAVENOUS

## 2019-05-29 MED ORDER — OXYCODONE HCL 5 MG PO TABS
5.0000 mg | ORAL_TABLET | Freq: Once | ORAL | Status: AC
  Administered 2019-05-29: 15:00:00 5 mg via ORAL

## 2019-05-29 MED ORDER — MIDAZOLAM HCL 2 MG/2ML IJ SOLN
INTRAMUSCULAR | Status: AC
  Administered 2019-05-29: 2 mg via INTRAVENOUS
  Filled 2019-05-29: qty 2

## 2019-05-29 MED ORDER — METHOCARBAMOL 500 MG PO TABS: 500.0000 mg | ORAL_TABLET | Freq: Three times a day (TID) | ORAL | 0 refills | Status: DC | PRN

## 2019-05-29 MED ORDER — PROPOFOL 10 MG/ML IV BOLUS
INTRAVENOUS | Status: DC | PRN
  Administered 2019-05-29: 200 mg via INTRAVENOUS

## 2019-05-29 MED ORDER — BUPIVACAINE-EPINEPHRINE 0.25% -1:200000 IJ SOLN
INTRAMUSCULAR | Status: AC
  Filled 2019-05-29: qty 1

## 2019-05-29 MED ORDER — MIDAZOLAM HCL 2 MG/2ML IJ SOLN
2.0000 mg | Freq: Once | INTRAMUSCULAR | Status: AC
  Administered 2019-05-29: 09:00:00 2 mg via INTRAVENOUS

## 2019-05-29 MED ORDER — OXYCODONE HCL 5 MG PO TABS: 5.0000 mg | ORAL_TABLET | Freq: Four times a day (QID) | ORAL | 0 refills | Status: DC | PRN

## 2019-05-29 SURGICAL SUPPLY — 103 items
ALCOHOL 70% 16 OZ (MISCELLANEOUS) ×3 IMPLANT
ANCH SUT 2-0 5 STRL LF DISP (Miscellaneous) ×2 IMPLANT
ANCHOR BUTTON TIGHTROPE ACL RT (Orthopedic Implant) ×3 IMPLANT
BANDAGE ESMARK 6X9 LF (GAUZE/BANDAGES/DRESSINGS) IMPLANT
BLADE SURG 10 STRL SS (BLADE) ×3 IMPLANT
BLADE SURG 15 STRL LF DISP TIS (BLADE) ×2 IMPLANT
BLADE SURG 15 STRL SS (BLADE) ×6
BNDG CMPR 9X6 STRL LF SNTH (GAUZE/BANDAGES/DRESSINGS)
BNDG CMPR MED 15X6 ELC VLCR LF (GAUZE/BANDAGES/DRESSINGS) ×1
BNDG ELASTIC 6X15 VLCR STRL LF (GAUZE/BANDAGES/DRESSINGS) ×3 IMPLANT
BNDG ESMARK 6X9 LF (GAUZE/BANDAGES/DRESSINGS)
BURR OVAL 8 FLU 4.0MM X 13CM (MISCELLANEOUS) ×1
BURR OVAL 8 FLU 4.0X13 (MISCELLANEOUS) ×1 IMPLANT
CLOSURE WOUND 1/2 X4 (GAUZE/BANDAGES/DRESSINGS) ×1
COVER MAYO STAND STRL (DRAPES) ×3 IMPLANT
COVER SURGICAL LIGHT HANDLE (MISCELLANEOUS) ×3 IMPLANT
COVER WAND RF STERILE (DRAPES) ×1 IMPLANT
CUFF TOURN SGL QUICK 34 (TOURNIQUET CUFF) ×3
CUFF TRNQT CYL 34X4.125X (TOURNIQUET CUFF) ×1 IMPLANT
CUTTER SUT JUGGER STITCH CU (CUTTER) ×2 IMPLANT
DECANTER SPIKE VIAL GLASS SM (MISCELLANEOUS) ×3 IMPLANT
DISSECTOR 4.0MM X 13CM (MISCELLANEOUS) ×3 IMPLANT
DRAPE ARTHROSCOPY W/POUCH 114 (DRAPES) ×3 IMPLANT
DRAPE INCISE IOBAN 66X45 STRL (DRAPES) ×3 IMPLANT
DRAPE OEC MINIVIEW 54X84 (DRAPES) ×2 IMPLANT
DRAPE ORTHO SPLIT 77X108 STRL (DRAPES) ×3
DRAPE SURG ORHT 6 SPLT 77X108 (DRAPES) ×1 IMPLANT
DRAPE U-SHAPE 47X51 STRL (DRAPES) ×3 IMPLANT
DRILL FLIPCUTTER II 10MM (CUTTER) ×1 IMPLANT
DRILL FLIPCUTTER II 7.5MM (MISCELLANEOUS) IMPLANT
DRILL FLIPCUTTER II 8.0MM (INSTRUMENTS) IMPLANT
DRILL FLIPCUTTER II 8.5MM (INSTRUMENTS) IMPLANT
DRILL FLIPCUTTER II 9.0MM (INSTRUMENTS) IMPLANT
DRSG PAD ABDOMINAL 8X10 ST (GAUZE/BANDAGES/DRESSINGS) ×3 IMPLANT
DRSG TEGADERM 2-3/8X2-3/4 SM (GAUZE/BANDAGES/DRESSINGS) ×2 IMPLANT
DRSG TEGADERM 4X4.75 (GAUZE/BANDAGES/DRESSINGS) ×12 IMPLANT
DRSG XEROFORM 1X8 (GAUZE/BANDAGES/DRESSINGS) ×2 IMPLANT
DURAPREP 26ML APPLICATOR (WOUND CARE) ×6 IMPLANT
DW OUTFLOW CASSETTE/TUBE SET (MISCELLANEOUS) ×3 IMPLANT
ELECT REM PT RETURN 9FT ADLT (ELECTROSURGICAL) ×3
ELECTRODE REM PT RTRN 9FT ADLT (ELECTROSURGICAL) ×1 IMPLANT
FILTER STRAW FLUID ASPIR (MISCELLANEOUS) ×2 IMPLANT
FLIPCUTTER II 10MM (CUTTER) ×3
FLIPCUTTER II 7.5MM (MISCELLANEOUS)
FLIPCUTTER II 8.0MM (INSTRUMENTS)
FLIPCUTTER II 8.5MM (INSTRUMENTS)
FLIPCUTTER II 9.0MM (INSTRUMENTS) ×3
GAUZE SPONGE 4X4 12PLY STRL (GAUZE/BANDAGES/DRESSINGS) ×2 IMPLANT
GAUZE SPONGE 4X4 12PLY STRL LF (GAUZE/BANDAGES/DRESSINGS) ×3 IMPLANT
GAUZE XEROFORM 1X8 LF (GAUZE/BANDAGES/DRESSINGS) ×6 IMPLANT
GLOVE BIOGEL PI IND STRL 8 (GLOVE) ×1 IMPLANT
GLOVE BIOGEL PI INDICATOR 8 (GLOVE) ×2
GLOVE ORTHO TXT STRL SZ7.5 (GLOVE) ×6 IMPLANT
GLOVE SURG ORTHO 8.0 STRL STRW (GLOVE) ×5 IMPLANT
GOWN STRL REUS W/ TWL LRG LVL3 (GOWN DISPOSABLE) ×3 IMPLANT
GOWN STRL REUS W/TWL LRG LVL3 (GOWN DISPOSABLE) ×9
IMMOBILIZER KNEE 22 UNIV (SOFTGOODS) ×3 IMPLANT
JUGGERSTITCH IMPLANT CVD (Miscellaneous) ×4 IMPLANT
JUGGERSTITCH SLED CANNULA (MISCELLANEOUS) ×2 IMPLANT
KIT BASIN OR (CUSTOM PROCEDURE TRAY) ×3 IMPLANT
KIT BIOCARTILAGE DEL W/SYRINGE (KITS) IMPLANT
KIT BIOCARTILAGE LG JOINT MIX (KITS) ×3 IMPLANT
KIT BONE MRW ASP ANGEL CPRP (KITS) ×2 IMPLANT
KIT TURNOVER KIT B (KITS) ×3 IMPLANT
MANIFOLD NEPTUNE II (INSTRUMENTS) ×5 IMPLANT
NDL 18GX1X1/2 (RX/OR ONLY) (NEEDLE) ×1 IMPLANT
NDL HYPO 18GX1.5 BLUNT FILL (NEEDLE) ×1 IMPLANT
NEEDLE 18GX1X1/2 (RX/OR ONLY) (NEEDLE) ×9 IMPLANT
NEEDLE HYPO 18GX1.5 BLUNT FILL (NEEDLE) IMPLANT
NS IRRIG 1000ML POUR BTL (IV SOLUTION) ×3 IMPLANT
PACK ARTHROSCOPY DSU (CUSTOM PROCEDURE TRAY) ×3 IMPLANT
PAD ARMBOARD 7.5X6 YLW CONV (MISCELLANEOUS) ×6 IMPLANT
PAD CAST 4YDX4 CTTN HI CHSV (CAST SUPPLIES) ×1 IMPLANT
PADDING CAST COTTON 4X4 STRL (CAST SUPPLIES) ×3
PADDING CAST COTTON 6X4 STRL (CAST SUPPLIES) ×4 IMPLANT
PENCIL BUTTON HOLSTER BLD 10FT (ELECTRODE) ×3 IMPLANT
PK GRAFTLINK AUTO IMPLANT SYST (Anchor) ×3 IMPLANT
PUTTY DBM ALLOSYNC PURE 5CC (Putty) ×2 IMPLANT
SPONGE LAP 18X18 RF (DISPOSABLE) ×3 IMPLANT
SPONGE LAP 4X18 RFD (DISPOSABLE) ×2 IMPLANT
STRIP CLOSURE SKIN 1/2X4 (GAUZE/BANDAGES/DRESSINGS) ×2 IMPLANT
SUCTION FRAZIER HANDLE 10FR (MISCELLANEOUS) ×2
SUCTION TUBE FRAZIER 10FR DISP (MISCELLANEOUS) ×1 IMPLANT
SUT ETHILON 3 0 PS 1 (SUTURE) ×6 IMPLANT
SUT MNCRL AB 3-0 PS2 18 (SUTURE) ×5 IMPLANT
SUT VIC AB 0 CT1 27 (SUTURE) ×6
SUT VIC AB 0 CT1 27XBRD ANBCTR (SUTURE) ×1 IMPLANT
SUT VIC AB 2-0 CT1 27 (SUTURE) ×9
SUT VIC AB 2-0 CT1 TAPERPNT 27 (SUTURE) ×1 IMPLANT
SUT VICRYL 0 UR6 27IN ABS (SUTURE) ×5 IMPLANT
SUTURE TAPE 1.3 FIBERLOP 20 ST (SUTURE) ×1 IMPLANT
SUTURETAPE 1.3 FIBERLOOP 20 ST (SUTURE) ×3
SYR 30ML LL (SYRINGE) ×3 IMPLANT
SYR 3ML LL SCALE MARK (SYRINGE) ×3 IMPLANT
SYR BULB IRRIGATION 50ML (SYRINGE) ×3 IMPLANT
SYR TB 1ML LUER SLIP (SYRINGE) ×3 IMPLANT
SYSTEM GRAFT IMPLANT AUTOGRAFT (Anchor) ×1 IMPLANT
TOWEL GREEN STERILE (TOWEL DISPOSABLE) ×3 IMPLANT
TOWEL GREEN STERILE FF (TOWEL DISPOSABLE) ×3 IMPLANT
TUBING ARTHROSCOPY IRRIG 16FT (MISCELLANEOUS) ×3 IMPLANT
UNDERPAD 30X30 (UNDERPADS AND DIAPERS) ×3 IMPLANT
WRAP KNEE MAXI GEL POST OP (GAUZE/BANDAGES/DRESSINGS) ×3 IMPLANT
YANKAUER SUCT BULB TIP NO VENT (SUCTIONS) ×3 IMPLANT

## 2019-05-29 NOTE — Brief Op Note (Signed)
05/29/2019   05/29/2019  1:11 PM  PATIENT:  Micheal Bentley  18 y.o. male  PRE-OPERATIVE DIAGNOSIS:  right anterior cruciate ligament tear, medial meniscal tear, chondral defect  POST-OPERATIVE DIAGNOSIS:  right anterior cruciate ligament tear, medial meniscal tear, chondral defect  PROCEDURE:  Procedure(s): RIGHT KNEE RECONSTRUCTION ANTERIOR CRUCIATE LIGAMENT (ACL) WITH HAMSTRING AUTOGRAFT, MENISCAL REPAIR  SURGEON:  Surgeon(s): Marlou Sa, Tonna Corner, MD  ASSISTANT: magnant pa  ANESTHESIA:   general  EBL: 25 ml    Total I/O In: 2000 [I.V.:2000] Out: -   BLOOD ADMINISTERED: none  DRAINS: none   LOCAL MEDICATIONS USED:    SPECIMEN:  No Specimen  COUNTS:  YES  TOURNIQUET:  * Missing tourniquet times found for documented tourniquets in log: [id number] *  DICTATION: .Other Dictation: Dictation Number [id number]  PLAN OF CARE: Discharge to home after PACU  PATIENT DISPOSITION:  PACU - hemodynamically stable  Plan for osteochondral allograft medial femoral condyle.  The lesion is really too big for mosaic plasty.  I do not think bio cartilage or microfracture would necessarily be a long-term solution for this young active 18 year old patient.  Based on the size of the lesion and a full-thickness component of the lesion and the relatively spared nature of the tibial articular cartilage and osteochondral allograft will give him the best chance of a durable solution.

## 2019-05-29 NOTE — Op Note (Signed)
NAME: Micheal Bentley, Micheal Bentley MEDICAL RECORD SE:[id number] ACCOUNT [id number] DATE OF BIRTH:Nov 13, 2000 FACILITY: [facility] LOCATION: [facility] PHYSICIAN:Zorana Brockwell Randel Pigg, MD  OPERATIVE REPORT  DATE OF PROCEDURE:  05/29/2019  PREOPERATIVE DIAGNOSIS:  Right knee anterior cruciate ligament tear, medial meniscal tear.  POSTOPERATIVE DIAGNOSIS:  Right knee anterior cruciate ligament tear, medial meniscal tear, and full-thickness chondral defect involving about 2 cm x 2.5 cm on the medial femoral condyle with unstable flaps, which were debrided.  PROCEDURE:  Left knee anterior cruciate ligament reconstruction with medial meniscal repair and debridement of chondral defect, medial femoral condyle, with plans for later osteochondral allograft implantation.  SURGEON:  Meredith Pel, MD  ASSISTANT:  Annie Main, PA-C   INDICATIONS:  This is a 18 year old patient with an ACL injury.  He has been playing soccer for about a year on this torn ACL.  He comes in with pain, instability, and mechanical symptoms.  MRI scan shows what appeared to be relatively smaller chondral  defect, along with ligament tear and medial meniscal tear.  PROCEDURE IN DETAIL:  The patient was brought to the operating room where general anesthetic was induced.  Preoperative antibiotics were administered.  Timeout was called.  Blood was drawn for PRP to be added to the blood bone graft that was used to fill  in the bone tunnels.  At this time, right knee was pre-scrubbed with alcohol and Betadine and allowed to air dry.  Prepped with DuraPrep solution and draped in a sterile manner.  Ioban used to cover the operative field.  Timeout was called.  Right knee  was examined under anesthesia and found to have significant laxity to ACL examination.  PCL was intact.  Collaterals were stable.  No posterolateral rotatory instability was noted.  The portals were anesthetized using Marcaine with epinephrine.  An  incision was made over the pes  bursa tendons.  Semitendinosis tendon was harvested, prepared on the back table by [facility] using Arthrex dual Endobutton technique to a size 10 femur and 9 tibia.  At this time concurrently, arthroscopy was performed.  The  patient had a very large chondral defect, full thickness, on the medial femoral condyle measuring about 2 x 2.5 cm.  Loose chondral flaps were debrided.  ACL was torn.  Notchplasty performed.  The lateral compartment articular cartilage was intact.  The  patient also had a medial meniscal tear, some of which was resectable.  The other 2 horizontal cleavage released posteriorly were repairable.  At this time, the patellofemoral compartment was also inspected and found to be intact.  No definite loose  bodies within the medial and lateral gutter.  At this time, the unsalvageable portion of the meniscal tear was debrided.  The horizontal cleft was then closed using Biomet all-inside suture anchors x2.  This gave very good repair of that cleft.  The  chondral defect was debrided.  There was a large full-thickness chondral defect present, which he will require later osteochondral allograft placement.  At this time, the tunnel was drilled using the Arthrex FlipCutter, 9 o'clock position on that femoral  condyle.  Similarly, a tunnel was drilled on the femoral side.  The graft was passed and bone graft was placed into the tunnel.  Fluoroscopy was used to confirm that the button had flipped.  We did get good fixation.  At this time, the knee was taken  through a range of motion.  Good isometry of the graft was present, secured on the femoral side and extension over Endobutton.  At this time, good stability of the knee was achieved.  Thorough irrigation of the joint was performed.  The harvest site was  closed using 0 Vicryl suture, 2-0 Vicryl suture, and 3-0 Monocryl.  Portals were closed using 3-0 Vicryl and 2-0 and 3-0 nylon.  A solution of Marcaine, morphine, clonidine was injected both into the  knee joint as well as into the portal areas for postop  pain relief.  Impervious dressings and a bulky knee wrap and then knee immobilizer were placed.  The patient tolerated the procedure well without immediate complication.  Luke's assistance was required at all times during the case for retraction, graft  preparation, opening and closing.  His assistance was a medical necessity.  LN/NUANCE  D:05/29/2019 T:05/29/2019 JOB:[id number]

## 2019-05-29 NOTE — Progress Notes (Signed)
Orthopedic Tech Progress Note Patient Details:  Micheal Bentley [birth year] [id number]  Ortho Devices Type of Ortho Device: Crutches Ortho Device/Splint Interventions: Ordered, Adjustment   Post Interventions Patient Tolerated: Well Instructions Provided: Care of device   Braulio Bosch 05/29/2019, 2:58 PM

## 2019-05-29 NOTE — Transfer of Care (Signed)
Immediate Anesthesia Transfer of Care Note  Patient: Micheal Bentley  Procedure(s) Performed: RIGHT KNEE RECONSTRUCTION ANTERIOR CRUCIATE LIGAMENT (ACL) WITH HAMSTRING AUTOGRAFT, MENISCAL REPAIR (Right Knee)  Patient Location: PACU  Anesthesia Type:General and Regional  Level of Consciousness: awake, alert  and oriented  Airway & Oxygen Therapy: Patient Spontanous Breathing  Post-op Assessment: Report given to RN and Post -op Vital signs reviewed and stable  Post vital signs: Reviewed and stable  Last Vitals:  Vitals Value Taken Time  BP    Temp    Pulse 99 05/29/19 1353  Resp 13 05/29/19 1353  SpO2 100 % 05/29/19 1353  Vitals shown include unvalidated device data.  Last Pain:  Vitals:   05/29/19 0925  TempSrc:   PainSc: 0-No pain      Patients Stated Pain Goal: 3 (16/10/96 0454)  Complications: No apparent anesthesia complications

## 2019-05-29 NOTE — Anesthesia Postprocedure Evaluation (Signed)
Anesthesia Post Note  Patient: Micheal Bentley  Procedure(s) Performed: RIGHT KNEE RECONSTRUCTION ANTERIOR CRUCIATE LIGAMENT (ACL) WITH HAMSTRING AUTOGRAFT, MENISCAL REPAIR (Right Knee)     Patient location during evaluation: PACU Anesthesia Type: General Level of consciousness: awake and alert Pain management: pain level controlled Vital Signs Assessment: post-procedure vital signs reviewed and stable Respiratory status: spontaneous breathing, nonlabored ventilation, respiratory function stable and patient connected to nasal cannula oxygen Cardiovascular status: blood pressure returned to baseline and stable Postop Assessment: no apparent nausea or vomiting Anesthetic complications: no Comments: RUE IV infiltration. No pain per patient, palpable pulses.   Pt instructed to monitor at home and go to ED if pain worsens or pulse is weaker.     Last Vitals:  Vitals:   05/29/19 1423 05/29/19 1438  BP: 116/80 118/69  Pulse: 88 86  Resp: 16 14  Temp:  (!) 36.2 C  SpO2: 97% 99%    Last Pain:  Vitals:   05/29/19 1438  TempSrc:   PainSc: 0-No pain                 Effie Berkshire

## 2019-05-29 NOTE — Anesthesia Procedure Notes (Signed)
Procedure Name: LMA Insertion Date/Time: 05/29/2019 10:27 AM Performed by: Babs Bertin, CRNA Pre-anesthesia Checklist: Patient identified, Emergency Drugs available, Suction available and Patient being monitored Patient Re-evaluated:Patient Re-evaluated prior to induction Oxygen Delivery Method: Circle System Utilized Preoxygenation: Pre-oxygenation with 100% oxygen Induction Type: IV induction Ventilation: Mask ventilation without difficulty LMA: LMA inserted LMA Size: 4.0 Number of attempts: 1 Airway Equipment and Method: Bite block Placement Confirmation: positive ETCO2 Tube secured with: Tape Dental Injury: Teeth and Oropharynx as per pre-operative assessment

## 2019-05-29 NOTE — Anesthesia Preprocedure Evaluation (Addendum)
Anesthesia Evaluation  Patient identified by MRN, date of birth, ID band Patient awake    Reviewed: Allergy & Precautions, NPO status , Patient's Chart, lab work & pertinent test results  Airway Mallampati: I  TM Distance: >3 FB Neck ROM: Full    Dental  (+) Teeth Intact, Dental Advisory Given   Pulmonary asthma ,    breath sounds clear to auscultation       Cardiovascular negative cardio ROS   Rhythm:Regular Rate:Normal     Neuro/Psych negative neurological ROS  negative psych ROS   GI/Hepatic negative GI ROS, Neg liver ROS,   Endo/Other  negative endocrine ROS  Renal/GU negative Renal ROS     Musculoskeletal negative musculoskeletal ROS (+)   Abdominal Normal abdominal exam  (+)   Peds  Hematology negative hematology ROS (+)   Anesthesia Other Findings   Reproductive/Obstetrics                            Anesthesia Physical Anesthesia Plan  ASA: II  Anesthesia Plan: General   Post-op Pain Management: GA combined w/ Regional for post-op pain   Induction: Intravenous  PONV Risk Score and Plan: 3 and Ondansetron, Dexamethasone and Midazolam  Airway Management Planned: LMA  Additional Equipment: None  Intra-op Plan:   Post-operative Plan: Extubation in OR  Informed Consent: I have reviewed the patients History and Physical, chart, labs and discussed the procedure including the risks, benefits and alternatives for the proposed anesthesia with the patient or authorized representative who has indicated his/her understanding and acceptance.     Dental advisory given  Plan Discussed with: CRNA  Anesthesia Plan Comments:        Anesthesia Quick Evaluation

## 2019-05-29 NOTE — Anesthesia Procedure Notes (Signed)
Anesthesia Regional Block: Adductor canal block   Pre-Anesthetic Checklist: ,, timeout performed, Correct Patient, Correct Site, Correct Laterality, Correct Procedure, Correct Position, site marked, Risks and benefits discussed,  Surgical consent,  Pre-op evaluation,  At surgeon's request and post-op pain management  Laterality: Right  Prep: chloraprep       Needles:  Injection technique: Single-shot  Needle Type: Echogenic Stimulator Needle     Needle Length: 9cm  Needle Gauge: 21     Additional Needles:   Procedures:,,,, ultrasound used (permanent image in chart),,,,  Narrative:  Start time: 05/29/2019 9:15 AM End time: 05/29/2019 9:25 AM Injection made incrementally with aspirations every 5 mL.  Performed by: Personally  Anesthesiologist: Effie Berkshire, MD  Additional Notes: Patient tolerated the procedure well. Local anesthetic introduced in an incremental fashion under minimal resistance after negative aspirations. No paresthesias were elicited. After completion of the procedure, no acute issues were identified and patient continued to be monitored by RN.

## 2019-05-29 NOTE — H&P (Signed)
Micheal Bentley is an 18 y.o. male.   Chief Complaint: Right knee pain and instability HPI: Micheal Bentley is a 18 year old patient with right knee pain and instability since soccer injury several months ago.  MRI scan shows ACL tear with meniscal tear and possible chondral defect..  He has been trying to play soccer on this knee and has had multiple episodes of instability.  Presents now for operative management after explanation of risks and benefits.  No personal or family history of DVT or pulmonary embolism  Past Medical History:  Diagnosis Date  . Asthma    Not an active issue  . Seasonal allergies     Past Surgical History:  Procedure Laterality Date  . NO PAST SURGERIES      Family History  Problem Relation Age of Onset  . Diabetes Father    Social History:  reports that he has never smoked. He has never used smokeless tobacco. He reports that he does not drink alcohol or use drugs.  Allergies: No Known Allergies  No medications prior to admission.    Results for orders placed or performed during the hospital encounter of 05/29/19 (from the past 48 hour(s))  CBC     Status: Abnormal   Collection Time: 05/29/19  7:51 AM  Result Value Ref Range   WBC 5.2 4.0 - 10.5 K/uL   RBC 5.15 4.22 - 5.81 MIL/uL   Hemoglobin 15.7 13.0 - 17.0 g/dL   HCT 16.1 09.6 - 04.5 %   MCV 90.9 80.0 - 100.0 fL   MCH 30.5 26.0 - 34.0 pg   MCHC 33.5 30.0 - 36.0 g/dL   RDW 40.9 81.1 - 91.4 %   Platelets 145 (L) 150 - 400 K/uL   nRBC 0.0 0.0 - 0.2 %    Comment: Performed at [facility], [address], [place], [place] [address]  Basic metabolic panel     Status: None   Collection Time: 05/29/19  7:51 AM  Result Value Ref Range   Sodium 139 135 - 145 mmol/L   Potassium 3.9 3.5 - 5.1 mmol/L   Chloride 105 98 - 111 mmol/L   CO2 26 22 - 32 mmol/L   Glucose, Bld 94 70 - 99 mg/dL   BUN 10 6 - 20 mg/dL   Creatinine, Ser 6.21 0.61 - 1.24 mg/dL   Calcium 9.2 8.9 - 30.8 mg/dL   GFR calc non Af  Amer >60 >60 mL/min   GFR calc Af Amer >60 >60 mL/min   Anion gap 8 5 - 15    Comment: Performed at [facility], [address], [place], [place] [address]   No results found.  Review of Systems  Musculoskeletal: Positive for joint pain.  All other systems reviewed and are negative.   Blood pressure (!) 113/50, pulse 82, temperature (!) 97.5 F (36.4 C), temperature source Oral, resp. rate 17, height 5\' 8"  (1.727 m), weight 79.4 kg, SpO2 100 %. Physical Exam  Constitutional: He appears well-developed.  HENT:  Head: Normocephalic.  Eyes: Pupils are equal, round, and reactive to light.  Neck: Normal range of motion.  Cardiovascular: Normal rate.  Respiratory: Effort normal.  Neurological: He is alert.  Skin: Skin is warm.  Psychiatric: He has a normal mood and affect.  Examination of the right knee demonstrates full extension and flexion to about 120.  Collaterals are stable.  ACL is out PCL is intact.  There is no posterior lateral rotatory instability on the  right knee versus left.  This is however during an awake exam.  Alignment is normal.  Assessment/Plan Impression is right knee ACL tear with meniscal tear and possible chondral damage.  Plan is ACL reconstruction using hamstring autograft along with meniscal debridement and/or repair.  The chondral defect we will have to assess but that may require microfracture and bio cartilage.  The risk and benefits of the procedure are discussed including but not limited to infection nerve vessel damage knee stiffness development of arthritis in the knee as well as potential need for more surgery.  Patient understands the risk and benefits.  All questions answered.  Anderson Malta, MD 05/29/2019, 9:56 AM

## 2019-05-30 ENCOUNTER — Encounter (HOSPITAL_COMMUNITY): Payer: Self-pay | Admitting: Orthopedic Surgery

## 2019-05-30 ENCOUNTER — Telehealth: Payer: Self-pay | Admitting: Orthopedic Surgery

## 2019-05-30 NOTE — Telephone Encounter (Signed)
I called patient and advised. He will call when he starts to run low on medication.

## 2019-05-30 NOTE — Telephone Encounter (Signed)
Pt called in said he had surgery with dr.dean yesterday 05-29-2019 for his right knee and he was given oxycodone 5 MG, but pt says that medication is not helping his pain at all and is requesting something stronger or a different medication.  Please have that sent to [facility] on [address].   [phone]

## 2019-05-30 NOTE — Telephone Encounter (Signed)
Please advise 

## 2019-05-30 NOTE — Telephone Encounter (Signed)
Okay to take 1 to 2 tablets every 3 to 4 hours as needed pain and if he runs out we will get him refill before the weekend please call thanks

## 2019-06-02 ENCOUNTER — Other Ambulatory Visit: Payer: Self-pay | Admitting: Surgical

## 2019-06-02 ENCOUNTER — Telehealth: Payer: Self-pay | Admitting: Orthopedic Surgery

## 2019-06-02 MED ORDER — OXYCODONE HCL 5 MG PO TABS: 5.0000 mg | ORAL_TABLET | ORAL | 0 refills | Status: DC | PRN

## 2019-06-02 NOTE — Telephone Encounter (Signed)
Patient called. Would like oxycodone called in. His call back number is [phone]

## 2019-06-02 NOTE — Telephone Encounter (Signed)
Please advise 

## 2019-06-07 ENCOUNTER — Other Ambulatory Visit: Payer: Self-pay

## 2019-06-07 ENCOUNTER — Ambulatory Visit (INDEPENDENT_AMBULATORY_CARE_PROVIDER_SITE_OTHER): Payer: Medicaid Other | Admitting: Orthopedic Surgery

## 2019-06-07 DIAGNOSIS — S83511D Sprain of anterior cruciate ligament of right knee, subsequent encounter: Secondary | ICD-10-CM

## 2019-06-09 ENCOUNTER — Encounter: Payer: Self-pay | Admitting: Orthopedic Surgery

## 2019-06-09 NOTE — Progress Notes (Signed)
   Post-Op Visit Note   Patient: Micheal Bentley           Date of Birth: 05-Aug-2001           MRN: [id number] Visit Date: 06/07/2019 PCP: [facility]   Assessment & Plan:  Chief Complaint:  Chief Complaint  Patient presents with  . Right Knee - Routine Post Op   Visit Diagnoses:  1. Rupture of anterior cruciate ligament of right knee, subsequent encounter     Plan: Patient is a 18 year old male who presents to the clinic s/p right knee ACL reconstruction with hamstring autograft and meniscal debridement on 05/29/2019.  Patient states that he is doing well.  He is at 84 degrees on CPM machine.  He is taking his pain medication about every 4-6 hours as needed.  On exam the graft is strong and stable.  He has an effusion but no calf tenderness.  He has 0 to 5 degrees of extension but difficulty with flexion, guarding throughout flexion range of motion.  This should continue to improve with time.  Went over patient's arthroscopy pictures with him, answering all questions to patient satisfaction.  Discussed chondral defect of femoral condyle that was found to be, on arthroscopy, bigger than MRI revealed.  The chondral defect was too large for microfracture; chondral defect size is over 2-1/2 cm.  Osteochondral allograft will be his best option for treatment particularly given the meniscal deficiency which is present.  Discussed in depth with patient.  He agrees to be put on waiting list for donor cartilage allograft.  Once we have a donor we will proceed with surgery for a chondral defect.  Patient will follow-up in 3 weeks.  No calf tenderness today.  Negative Homans' sign.  Follow-Up Instructions: Return in about 3 weeks (around 06/28/2019).   Orders:  No orders of the defined types were placed in this encounter.  No orders of the defined types were placed in this encounter.   Imaging: No results found.  PMFS History: Patient Active Problem List   Diagnosis Date Noted  . 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 Asthma    Not an active issue  . Seasonal allergies     Family History  Problem Relation Age of Onset  . Diabetes Father     Past Surgical History:  Procedure Laterality Date  . ANTERIOR CRUCIATE LIGAMENT REPAIR Right 05/29/2019   Procedure: RIGHT KNEE RECONSTRUCTION ANTERIOR CRUCIATE LIGAMENT (ACL) WITH HAMSTRING AUTOGRAFT, MENISCAL REPAIR;  Surgeon: Meredith Pel, MD;  Location: [place];  Service: Orthopedics;  Laterality: Right;  . NO PAST SURGERIES     Social History   Occupational History  . Not on file  Tobacco Use  . Smoking status: Never Smoker  . Smokeless tobacco: Never Used  Substance and Sexual Activity  . Alcohol use: No  . Drug use: No  . Sexual activity: Not on file

## 2019-06-28 ENCOUNTER — Other Ambulatory Visit: Payer: Self-pay

## 2019-06-28 ENCOUNTER — Ambulatory Visit (INDEPENDENT_AMBULATORY_CARE_PROVIDER_SITE_OTHER): Payer: Medicaid Other | Admitting: Orthopedic Surgery

## 2019-06-28 DIAGNOSIS — S83511D Sprain of anterior cruciate ligament of right knee, subsequent encounter: Secondary | ICD-10-CM

## 2019-06-29 ENCOUNTER — Encounter: Payer: Self-pay | Admitting: Orthopedic Surgery

## 2019-06-29 NOTE — Progress Notes (Signed)
   Post-Op Visit Note   Patient: Micheal Bentley           Date of Birth: [birth year]           MRN: [id number] Visit Date: 06/28/2019 [facility]CP: [facility]   Assessment & Plan:  Chief Complaint:  Chief Complaint  Patient presents with  . Right Knee - Follow-up   Visit Diagnoses:  1. Rupture of anterior cruciate ligament of right knee, subsequent encounter     Plan: Micheal Bentley is now about a month out right knee ACL reconstruction and meniscal debridement.  He has a large chondral defect on that medial femoral condyle.  On examination the graft is stable.  No calf tenderness.  He is got range of motion to about 90 degrees.  I will have him start physical therapy here to get some range of motion and strengthening then.  Plan for allograft osteochondral surgery on the 24th of this month.  I did encourage him to get as much range of motion as possible prior to surgical intervention.  Also want him to start doing some straight leg raises so he can have good strength going into his next surgery.  Follow-Up Instructions: No follow-ups on file.   Orders:  No orders of the defined types were placed in this encounter.  No orders of the defined types were placed in this encounter.   Imaging: No results found.  PMFS History: Patient Active Problem List   Diagnosis Date Noted  . 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 Asthma    Not an active issue  . Seasonal allergies     Family History  Problem Relation Age of Onset  . Diabetes Father     Past Surgical History:  Procedure Laterality Date  . ANTERIOR CRUCIATE LIGAMENT REPAIR Right 05/29/2019   Procedure: RIGHT KNEE RECONSTRUCTION ANTERIOR CRUCIATE LIGAMENT (ACL) WITH HAMSTRING AUTOGRAFT, MENISCAL REPAIR;  Surgeon: Meredith Pel, MD;  Location: [facility];  Service: Orthopedics;  Laterality: Right;  . NO PAST SURGERIES     Social History   Occupational History  .  Not on file  Tobacco Use  . Smoking status: Never Smoker  . Smokeless tobacco: Never Used  Substance and Sexual Activity  . Alcohol use: No  . Drug use: No  . Sexual activity: Not on file

## 2019-07-04 NOTE — Progress Notes (Signed)
[id number] [place], [place] [address] [name] [address] Phone: [phone] Fax: [phone]      Your procedure is scheduled on November 24  Report to [facility] Main Entrance "A" at [email or web address].M., and check in at the Admitting office.  Call this number if you have problems the morning of surgery:  [phone]  Call [phone] if you have any questions prior to your surgery date Monday-Friday 8am-4pm    Remember:  Do not eat after midnight the night before your surgery  You may drink clear liquids until 0820 am the morning of your surgery.   Clear liquids allowed are: Water, Non-Citrus Juices (without pulp), Carbonated Beverages, Clear Tea, Black Coffee Only, and Gatorade    There are No medications that you need to take the morning of surgery  Follow your surgeon's instructions on when to stop Aspirin.  If no instructions were given by your surgeon then you will need to call the office to get those instructions.    7 days prior to surgery STOP taking any Aspirin (unless otherwise instructed by your surgeon), Aleve, Naproxen, Ibuprofen, Motrin, Advil, Goody's, BC's, all herbal medications, fish oil, and all vitamins.    The Morning of Surgery  Do not wear jewelry  Do not wear lotions, powders, or colognes, or deodorant   Men may shave face and neck.  Do not bring valuables to the hospital.  [facility] is not responsible for any belongings or valuables.  If you are a smoker, DO NOT Smoke 24 hours prior to surgery  If you wear a CPAP at night please bring your mask, tubing, and machine the morning of surgery   Remember that you must have someone to transport you home after your surgery, and remain with you for 24 hours if you are discharged the same day.   Please bring cases for contacts, glasses, hearing aids, dentures or bridgework because it cannot be worn into surgery.     Leave your suitcase in the car.  After surgery it may be brought to your room.  For patients admitted to the hospital, discharge time will be determined by your treatment team.  Patients discharged the day of surgery will not be allowed to drive home.    Special instructions:   [place]- Preparing For Surgery  Before surgery, you can play an important role. Because skin is not sterile, your skin needs to be as free of germs as possible. You can reduce the number of germs on your skin by washing with CHG (chlorahexidine gluconate) Soap before surgery.  CHG is an antiseptic cleaner which kills germs and bonds with the skin to continue killing germs even after washing.    Oral Hygiene is also important to reduce your risk of infection.  Remember - BRUSH YOUR TEETH THE MORNING OF SURGERY WITH YOUR REGULAR TOOTHPASTE  Please do not use if you have an allergy to CHG or antibacterial soaps. If your skin becomes reddened/irritated stop using the CHG.  Do not shave (including legs and underarms) for at least 48 hours prior to first CHG shower. It is OK to shave your face.  Please follow these instructions carefully.   1. Shower the NIGHT BEFORE SURGERY and the MORNING OF SURGERY with CHG Soap.   2. If you chose to wash your hair, wash your hair first as usual with your normal shampoo.  3. After you shampoo, rinse  your hair and body thoroughly to remove the shampoo.  4. Use CHG as you would any other liquid soap. You can apply CHG directly to the skin and wash gently with a scrungie or a clean washcloth.   5. Apply the CHG Soap to your body ONLY FROM THE NECK DOWN.  Do not use on open wounds or open sores. Avoid contact with your eyes, ears, mouth and genitals (private parts). Wash Face and genitals (private parts)  with your normal soap.   6. Wash thoroughly, paying special attention to the area where your surgery will be performed.  7. Thoroughly rinse your body with warm water from  the neck down.  8. DO NOT shower/wash with your normal soap after using and rinsing off the CHG Soap.  9. Pat yourself dry with a CLEAN TOWEL.  10. Wear CLEAN PAJAMAS to bed the night before surgery, wear comfortable clothes the morning of surgery  11. Place CLEAN SHEETS on your bed the night of your first shower and DO NOT SLEEP WITH PETS.    Day of Surgery:  Please shower the morning of surgery with the CHG soap Do not apply any deodorants/lotions. Please wear clean clothes to the hospital/surgery center.   Remember to brush your teeth WITH YOUR REGULAR TOOTHPASTE.   Please read over the following fact sheets that you were given.

## 2019-07-05 ENCOUNTER — Encounter (HOSPITAL_BASED_OUTPATIENT_CLINIC_OR_DEPARTMENT_OTHER)
Admission: RE | Admit: 2019-07-05 | Discharge: 2019-07-05 | Disposition: A | Discharge status: Home / Self Care | Payer: Medicaid Other | Source: Ambulatory Visit | Attending: Orthopedic Surgery | Admitting: Orthopedic Surgery

## 2019-07-05 ENCOUNTER — Encounter (HOSPITAL_BASED_OUTPATIENT_CLINIC_OR_DEPARTMENT_OTHER): Payer: Self-pay | Admitting: *Deleted

## 2019-07-05 ENCOUNTER — Other Ambulatory Visit: Payer: Self-pay

## 2019-07-05 ENCOUNTER — Inpatient Hospital Stay (HOSPITAL_COMMUNITY)
Admission: RE | Admit: 2019-07-05 | Discharge: 2019-07-05 | Disposition: A | Discharge status: Home / Self Care | Payer: Medicaid Other | Source: Ambulatory Visit

## 2019-07-05 DIAGNOSIS — Z01812 Encounter for preprocedural laboratory examination: Secondary | ICD-10-CM | POA: Insufficient documentation

## 2019-07-05 LAB — CBC
HCT: 45.3 % (ref 39.0–52.0)
Hemoglobin: 15.4 g/dL (ref 13.0–17.0)
MCH: 30 pg (ref 26.0–34.0)
MCHC: 34 g/dL (ref 30.0–36.0)
MCV: 88.3 fL (ref 80.0–100.0)
Platelets: 110 10*3/uL — ABNORMAL LOW (ref 150–400)
RBC: 5.13 MIL/uL (ref 4.22–5.81)
RDW: 11.9 % (ref 11.5–15.5)
WBC: 6.7 10*3/uL (ref 4.0–10.5)
nRBC: 0 % (ref 0.0–0.2)

## 2019-07-05 LAB — BASIC METABOLIC PANEL
Anion gap: 9 (ref 5–15)
BUN: 9 mg/dL (ref 6–20)
CO2: 27 mmol/L (ref 22–32)
Calcium: 9.2 mg/dL (ref 8.9–10.3)
Chloride: 103 mmol/L (ref 98–111)
Creatinine, Ser: 1.1 mg/dL (ref 0.61–1.24)
GFR calc Af Amer: >60 mL/min (ref >60)
GFR calc non Af Amer: >60 mL/min (ref >60)
Glucose, Bld: 83 mg/dL (ref 70–99)
Potassium: 4.9 mmol/L (ref 3.5–5.1)
Sodium: 139 mmol/L (ref 135–145)

## 2019-07-05 NOTE — Progress Notes (Signed)
      Enhanced Recovery after Surgery for Orthopedics Enhanced Recovery after Surgery is a protocol used to improve the stress on your body and your recovery after surgery.  Patient Instructions  . The night before surgery:  o No food after midnight. ONLY clear liquids after midnight  . The day of surgery (if you do NOT have diabetes):  o Drink ONE (1) Pre-Surgery Clear Ensure as directed.   o This drink was given to you during your hospital  pre-op appointment visit. o The pre-op nurse will instruct you on the time to drink the  Pre-Surgery Ensure depending on your surgery time. o Finish the drink at the designated time by the pre-op nurse.  o Nothing else to drink after completing the  Pre-Surgery Clear Ensure.  . The day of surgery (if you have diabetes): o Drink ONE (1) Gatorade 2 (G2) as directed. o This drink was given to you during your hospital  pre-op appointment visit.  o The pre-op nurse will instruct you on the time to drink the   Gatorade 2 (G2) depending on your surgery time. o Color of the Gatorade may vary. Red is not allowed. o Nothing else to drink after completing the  Gatorade 2 (G2).         If you have questions, please contact your surgeon's office.  Surgical soap also given to patient with instructions for use.  Patient verbalized understanding of instructions. 

## 2019-07-05 NOTE — Progress Notes (Addendum)
I called the pt and asked that he go at 0900 on 11/20 for his covid test so that we will be able to order necessary supplies pending the result. Will ask the testing site to run test when it is obtained instead of batching. Pt agreeable and verbalized plan to go at 9am to [facility].   Called the pt back at 1pm and asked him to be at the testing site at 8am on 11/20, and to be sure to tell the person collecting the sample to take it directly to the lab. Pt verbalized understanding.

## 2019-07-07 ENCOUNTER — Other Ambulatory Visit (HOSPITAL_COMMUNITY)
Admission: RE | Admit: 2019-07-07 | Discharge: 2019-07-07 | Disposition: A | Discharge status: Home / Self Care | Payer: Medicaid Other | Source: Ambulatory Visit | Attending: Orthopedic Surgery | Admitting: Orthopedic Surgery

## 2019-07-07 DIAGNOSIS — Z20828 Contact with and (suspected) exposure to other viral communicable diseases: Secondary | ICD-10-CM | POA: Insufficient documentation

## 2019-07-07 DIAGNOSIS — Z01812 Encounter for preprocedural laboratory examination: Secondary | ICD-10-CM | POA: Insufficient documentation

## 2019-07-07 LAB — SARS CORONAVIRUS 2 (TAT 6-24 HRS): SARS Coronavirus 2: NEGATIVE

## 2019-07-08 NOTE — Progress Notes (Signed)
Should be ok

## 2019-07-11 ENCOUNTER — Ambulatory Visit (HOSPITAL_BASED_OUTPATIENT_CLINIC_OR_DEPARTMENT_OTHER): Payer: Medicaid Other | Admitting: Anesthesiology

## 2019-07-11 ENCOUNTER — Encounter (HOSPITAL_BASED_OUTPATIENT_CLINIC_OR_DEPARTMENT_OTHER): Payer: Self-pay | Admitting: *Deleted

## 2019-07-11 ENCOUNTER — Ambulatory Visit (HOSPITAL_COMMUNITY)
Admission: RE | Admit: 2019-07-11 | Discharge: 2019-07-12 | Disposition: A | Discharge status: Home / Self Care | Payer: Medicaid Other | Attending: Orthopedic Surgery | Admitting: Orthopedic Surgery

## 2019-07-11 ENCOUNTER — Encounter (HOSPITAL_BASED_OUTPATIENT_CLINIC_OR_DEPARTMENT_OTHER)
Admission: RE | Disposition: A | Discharge status: Home / Self Care | Payer: Self-pay | Source: Home / Self Care | Attending: Orthopedic Surgery

## 2019-07-11 DIAGNOSIS — M25561 Pain in right knee: Secondary | ICD-10-CM | POA: Diagnosis not present

## 2019-07-11 DIAGNOSIS — M958 Other specified acquired deformities of musculoskeletal system: Secondary | ICD-10-CM

## 2019-07-11 DIAGNOSIS — J45909 Unspecified asthma, uncomplicated: Secondary | ICD-10-CM | POA: Insufficient documentation

## 2019-07-11 DIAGNOSIS — M2410 Other articular cartilage disorders, unspecified site: Secondary | ICD-10-CM | POA: Diagnosis not present

## 2019-07-11 HISTORY — PX: OSTEOCHONDRAL DEFECT REPAIR/RECONSTRUCTION: SHX6232

## 2019-07-11 SURGERY — APPLICATION, GRAFT, OSTEOCHONDRAL, KNEE
Anesthesia: General | Site: Knee | Laterality: Right

## 2019-07-11 MED ORDER — PROPOFOL 10 MG/ML IV BOLUS
INTRAVENOUS | Status: DC | PRN
  Administered 2019-07-11: 200 mg via INTRAVENOUS

## 2019-07-11 MED ORDER — DEXAMETHASONE SODIUM PHOSPHATE 10 MG/ML IJ SOLN
INTRAMUSCULAR | Status: DC | PRN
  Administered 2019-07-11: 5 mg via INTRAVENOUS

## 2019-07-11 MED ORDER — EPHEDRINE 5 MG/ML INJ
INTRAVENOUS | Status: AC
  Filled 2019-07-11: qty 10

## 2019-07-11 MED ORDER — BUPIVACAINE-EPINEPHRINE (PF) 0.5% -1:200000 IJ SOLN
INTRAMUSCULAR | Status: DC | PRN
  Administered 2019-07-11: 30 mL via PERINEURAL

## 2019-07-11 MED ORDER — BUPIVACAINE HCL (PF) 0.25 % IJ SOLN
INTRAMUSCULAR | Status: AC
  Filled 2019-07-11: qty 30

## 2019-07-11 MED ORDER — MORPHINE SULFATE (PF) 4 MG/ML IV SOLN
INTRAVENOUS | Status: AC
  Filled 2019-07-11: qty 2

## 2019-07-11 MED ORDER — PHENYLEPHRINE 40 MCG/ML (10ML) SYRINGE FOR IV PUSH (FOR BLOOD PRESSURE SUPPORT)
PREFILLED_SYRINGE | INTRAVENOUS | Status: AC
  Filled 2019-07-11: qty 20

## 2019-07-11 MED ORDER — PHENYLEPHRINE HCL (PRESSORS) 10 MG/ML IV SOLN
INTRAVENOUS | Status: DC | PRN
  Administered 2019-07-11 (×3): 120 ug via INTRAVENOUS
  Administered 2019-07-11 (×2): 80 ug via INTRAVENOUS
  Administered 2019-07-11: 120 ug via INTRAVENOUS
  Administered 2019-07-11 (×2): 80 ug via INTRAVENOUS
  Administered 2019-07-11: 120 ug via INTRAVENOUS
  Administered 2019-07-11: 80 ug via INTRAVENOUS
  Administered 2019-07-11: 120 ug via INTRAVENOUS

## 2019-07-11 MED ORDER — MORPHINE SULFATE (PF) 4 MG/ML IV SOLN
INTRAVENOUS | Status: DC | PRN
  Administered 2019-07-11: 8 mg

## 2019-07-11 MED ORDER — MEPERIDINE HCL 25 MG/ML IJ SOLN: 6.2500 mg | INTRAMUSCULAR | Status: DC | PRN

## 2019-07-11 MED ORDER — CEFAZOLIN SODIUM-DEXTROSE 2-4 GM/100ML-% IV SOLN
2.0000 g | Freq: Four times a day (QID) | INTRAVENOUS | Status: AC
  Administered 2019-07-11 – 2019-07-12 (×3): 2 g via INTRAVENOUS
  Filled 2019-07-11 (×2): qty 100

## 2019-07-11 MED ORDER — ONDANSETRON HCL 4 MG/2ML IJ SOLN: 4.0000 mg | Freq: Four times a day (QID) | INTRAMUSCULAR | Status: DC | PRN

## 2019-07-11 MED ORDER — LACTATED RINGERS IV SOLN: INTRAVENOUS | Status: DC

## 2019-07-11 MED ORDER — ACETAMINOPHEN 160 MG/5ML PO SOLN: 325.0000 mg | Freq: Once | ORAL | Status: DC | PRN

## 2019-07-11 MED ORDER — FENTANYL CITRATE (PF) 100 MCG/2ML IJ SOLN
50.0000 ug | INTRAMUSCULAR | Status: AC | PRN
  Administered 2019-07-11: 100 ug via INTRAVENOUS
  Administered 2019-07-11: 25 ug via INTRAVENOUS
  Administered 2019-07-11: 100 ug via INTRAVENOUS
  Administered 2019-07-11 (×2): 25 ug via INTRAVENOUS

## 2019-07-11 MED ORDER — CLONIDINE HCL (ANALGESIA) 100 MCG/ML EP SOLN
EPIDURAL | Status: AC
  Filled 2019-07-11: qty 10

## 2019-07-11 MED ORDER — LACTATED RINGERS IV SOLN
INTRAVENOUS | Status: AC
  Administered 2019-07-11 (×2): via INTRAVENOUS

## 2019-07-11 MED ORDER — HYDROMORPHONE HCL 1 MG/ML IJ SOLN: 0.2500 mg | INTRAMUSCULAR | Status: DC | PRN

## 2019-07-11 MED ORDER — LACTATED RINGERS IV SOLN
INTRAVENOUS | Status: DC
  Administered 2019-07-11 (×2): via INTRAVENOUS

## 2019-07-11 MED ORDER — FENTANYL CITRATE (PF) 100 MCG/2ML IJ SOLN
INTRAMUSCULAR | Status: AC
  Filled 2019-07-11: qty 2

## 2019-07-11 MED ORDER — DEXAMETHASONE SODIUM PHOSPHATE 10 MG/ML IJ SOLN
INTRAMUSCULAR | Status: AC
  Filled 2019-07-11: qty 1

## 2019-07-11 MED ORDER — CEFAZOLIN SODIUM-DEXTROSE 2-4 GM/100ML-% IV SOLN
INTRAVENOUS | Status: AC
  Filled 2019-07-11: qty 100

## 2019-07-11 MED ORDER — ONDANSETRON HCL 4 MG/2ML IJ SOLN: 4.0000 mg | Freq: Once | INTRAMUSCULAR | Status: DC | PRN

## 2019-07-11 MED ORDER — DOCUSATE SODIUM 100 MG PO CAPS
100.0000 mg | ORAL_CAPSULE | Freq: Two times a day (BID) | ORAL | Status: DC
  Administered 2019-07-11: 100 mg via ORAL
  Filled 2019-07-11: qty 1

## 2019-07-11 MED ORDER — ONDANSETRON HCL 4 MG/2ML IJ SOLN
INTRAMUSCULAR | Status: DC | PRN
  Administered 2019-07-11: 4 mg via INTRAVENOUS

## 2019-07-11 MED ORDER — MIDAZOLAM HCL 2 MG/2ML IJ SOLN
INTRAMUSCULAR | Status: AC
  Filled 2019-07-11: qty 2

## 2019-07-11 MED ORDER — CEFAZOLIN SODIUM-DEXTROSE 2-4 GM/100ML-% IV SOLN: 2.0000 g | INTRAVENOUS | Status: AC

## 2019-07-11 MED ORDER — METHOCARBAMOL 1000 MG/10ML IJ SOLN: 500.0000 mg | Freq: Four times a day (QID) | INTRAVENOUS | Status: DC | PRN

## 2019-07-11 MED ORDER — METOCLOPRAMIDE HCL 5 MG/ML IJ SOLN: 5.0000 mg | Freq: Three times a day (TID) | INTRAMUSCULAR | Status: DC | PRN

## 2019-07-11 MED ORDER — ROCURONIUM BROMIDE 10 MG/ML (PF) SYRINGE
PREFILLED_SYRINGE | INTRAVENOUS | Status: AC
  Filled 2019-07-11: qty 10

## 2019-07-11 MED ORDER — EPHEDRINE SULFATE 50 MG/ML IJ SOLN
INTRAMUSCULAR | Status: DC | PRN
  Administered 2019-07-11 (×2): 10 mg via INTRAVENOUS

## 2019-07-11 MED ORDER — METHOCARBAMOL 500 MG PO TABS
500.0000 mg | ORAL_TABLET | Freq: Four times a day (QID) | ORAL | Status: DC | PRN
  Administered 2019-07-12: 06:00:00 500 mg via ORAL
  Filled 2019-07-11: qty 1

## 2019-07-11 MED ORDER — POVIDONE-IODINE 10 % EX SWAB: 2.0000 "application " | Freq: Once | CUTANEOUS | Status: DC

## 2019-07-11 MED ORDER — MIDAZOLAM HCL 2 MG/2ML IJ SOLN
1.0000 mg | INTRAMUSCULAR | Status: DC | PRN
  Administered 2019-07-11 (×2): 2 mg via INTRAVENOUS

## 2019-07-11 MED ORDER — BUPIVACAINE HCL (PF) 0.25 % IJ SOLN
INTRAMUSCULAR | Status: DC | PRN
  Administered 2019-07-11: 20 mL

## 2019-07-11 MED ORDER — HEPARIN SODIUM (PORCINE) 5000 UNIT/ML IJ SOLN
INTRAMUSCULAR | Status: AC
  Filled 2019-07-11: qty 1

## 2019-07-11 MED ORDER — PROMETHAZINE HCL 25 MG/ML IJ SOLN: 6.2500 mg | INTRAMUSCULAR | Status: DC | PRN

## 2019-07-11 MED ORDER — ACETAMINOPHEN 10 MG/ML IV SOLN: 1000.0000 mg | Freq: Once | INTRAVENOUS | Status: DC | PRN

## 2019-07-11 MED ORDER — CHLORHEXIDINE GLUCONATE 4 % EX LIQD: 60.0000 mL | Freq: Once | CUTANEOUS | Status: DC

## 2019-07-11 MED ORDER — CELECOXIB 200 MG PO CAPS
200.0000 mg | ORAL_CAPSULE | Freq: Two times a day (BID) | ORAL | Status: DC
  Administered 2019-07-11: 200 mg via ORAL
  Filled 2019-07-11: qty 1

## 2019-07-11 MED ORDER — TRAMADOL HCL 50 MG PO TABS
50.0000 mg | ORAL_TABLET | Freq: Four times a day (QID) | ORAL | Status: DC
  Administered 2019-07-11 – 2019-07-12 (×3): 50 mg via ORAL
  Filled 2019-07-11 (×3): qty 1

## 2019-07-11 MED ORDER — ONDANSETRON HCL 4 MG PO TABS: 4.0000 mg | ORAL_TABLET | Freq: Four times a day (QID) | ORAL | Status: DC | PRN

## 2019-07-11 MED ORDER — SODIUM CHLORIDE (PF) 0.9 % IJ SOLN
INTRAMUSCULAR | Status: AC
  Filled 2019-07-11: qty 10

## 2019-07-11 MED ORDER — ACETAMINOPHEN 325 MG PO TABS: 325.0000 mg | ORAL_TABLET | Freq: Four times a day (QID) | ORAL | Status: DC | PRN

## 2019-07-11 MED ORDER — LIDOCAINE 2% (20 MG/ML) 5 ML SYRINGE
INTRAMUSCULAR | Status: AC
  Filled 2019-07-11: qty 5

## 2019-07-11 MED ORDER — CLONIDINE HCL (ANALGESIA) 100 MCG/ML EP SOLN
EPIDURAL | Status: DC | PRN
  Administered 2019-07-11: 100 ug via INTRA_ARTICULAR

## 2019-07-11 MED ORDER — ONDANSETRON HCL 4 MG/2ML IJ SOLN
INTRAMUSCULAR | Status: AC
  Filled 2019-07-11: qty 2

## 2019-07-11 MED ORDER — ACETAMINOPHEN 325 MG PO TABS: 325.0000 mg | ORAL_TABLET | Freq: Once | ORAL | Status: DC | PRN

## 2019-07-11 MED ORDER — METOCLOPRAMIDE HCL 5 MG PO TABS: 5.0000 mg | ORAL_TABLET | Freq: Three times a day (TID) | ORAL | Status: DC | PRN

## 2019-07-11 MED ORDER — EPHEDRINE 5 MG/ML INJ
INTRAVENOUS | Status: AC
  Filled 2019-07-11: qty 20

## 2019-07-11 MED ORDER — CEFAZOLIN SODIUM-DEXTROSE 2-3 GM-%(50ML) IV SOLR
INTRAVENOUS | Status: DC | PRN
  Administered 2019-07-11: 2 g via INTRAVENOUS

## 2019-07-11 MED ORDER — FENTANYL CITRATE (PF) 100 MCG/2ML IJ SOLN: 25.0000 ug | INTRAMUSCULAR | Status: DC | PRN

## 2019-07-11 MED ORDER — HYDROCODONE-ACETAMINOPHEN 7.5-325 MG PO TABS: 1.0000 | ORAL_TABLET | Freq: Once | ORAL | Status: DC | PRN

## 2019-07-11 MED ORDER — OXYCODONE HCL 5 MG PO TABS
5.0000 mg | ORAL_TABLET | ORAL | Status: DC | PRN
  Administered 2019-07-11 – 2019-07-12 (×2): 5 mg via ORAL
  Filled 2019-07-11 (×2): qty 1

## 2019-07-11 MED ORDER — PROPOFOL 10 MG/ML IV BOLUS
INTRAVENOUS | Status: AC
  Filled 2019-07-11: qty 40

## 2019-07-11 MED ORDER — LIDOCAINE HCL (CARDIAC) PF 100 MG/5ML IV SOSY
PREFILLED_SYRINGE | INTRAVENOUS | Status: DC | PRN
  Administered 2019-07-11: 100 mg via INTRAVENOUS

## 2019-07-11 SURGICAL SUPPLY — 71 items
BANDAGE ESMARK 6X9 LF (GAUZE/BANDAGES/DRESSINGS) ×1 IMPLANT
BLADE AVERAGE 25MMX9MM (BLADE) ×1
BLADE AVERAGE 25X9 (BLADE) ×2 IMPLANT
BLADE SAW SAG HD 89.5X25X.94 (BLADE) ×2 IMPLANT
BLADE SURG 15 STRL LF DISP TIS (BLADE) ×1 IMPLANT
BLADE SURG 15 STRL SS (BLADE) ×3
BNDG CMPR 9X6 STRL LF SNTH (GAUZE/BANDAGES/DRESSINGS) ×1
BNDG ELASTIC 4X5.8 VLCR STR LF (GAUZE/BANDAGES/DRESSINGS) ×3 IMPLANT
BNDG ELASTIC 6X5.8 VLCR STR LF (GAUZE/BANDAGES/DRESSINGS) ×3 IMPLANT
BNDG ESMARK 6X9 LF (GAUZE/BANDAGES/DRESSINGS) ×3
COVER BACK TABLE 60X90IN (DRAPES) ×3 IMPLANT
COVER WAND RF STERILE (DRAPES) IMPLANT
CUFF TOURN SGL QUICK 34 (TOURNIQUET CUFF) ×3
CUFF TRNQT CYL 34X4.125X (TOURNIQUET CUFF) IMPLANT
DRAPE EXTREMITY T 121X128X90 (DISPOSABLE) ×3 IMPLANT
DRAPE HALF SHEET 70X43 (DRAPES) IMPLANT
DRAPE IMP U-DRAPE 54X76 (DRAPES) ×3 IMPLANT
DRAPE INCISE IOBAN 66X45 STRL (DRAPES) ×3 IMPLANT
DRAPE LAPAROTOMY T 98X78 PEDS (DRAPES) ×2 IMPLANT
DRAPE TOP ARMCOVERS (MISCELLANEOUS) IMPLANT
DRAPE U-SHAPE 47X51 STRL (DRAPES) ×6 IMPLANT
DRSG AQUACEL AG ADV 3.5X10 (GAUZE/BANDAGES/DRESSINGS) ×2 IMPLANT
DRSG TEGADERM 2-3/8X2-3/4 SM (GAUZE/BANDAGES/DRESSINGS) IMPLANT
DRSG TEGADERM 4X4.75 (GAUZE/BANDAGES/DRESSINGS) ×4 IMPLANT
DURAPREP 26ML APPLICATOR (WOUND CARE) ×3 IMPLANT
ELECT REM PT RETURN 9FT ADLT (ELECTROSURGICAL) ×3
ELECTRODE REM PT RTRN 9FT ADLT (ELECTROSURGICAL) IMPLANT
GAUZE 4X4 16PLY RFD (DISPOSABLE) IMPLANT
GAUZE SPONGE 4X4 12PLY STRL (GAUZE/BANDAGES/DRESSINGS) ×3 IMPLANT
GAUZE XEROFORM 1X8 LF (GAUZE/BANDAGES/DRESSINGS) ×3 IMPLANT
GLOVE BIO SURGEON STRL SZ7 (GLOVE) ×3 IMPLANT
GLOVE BIOGEL PI IND STRL 7.0 (GLOVE) ×1 IMPLANT
GLOVE BIOGEL PI IND STRL 8 (GLOVE) ×1 IMPLANT
GLOVE BIOGEL PI INDICATOR 7.0 (GLOVE) ×6
GLOVE BIOGEL PI INDICATOR 8 (GLOVE) ×2
GLOVE ECLIPSE 6.5 STRL STRAW (GLOVE) ×2 IMPLANT
GLOVE SURG ORTHO 8.0 STRL STRW (GLOVE) ×3 IMPLANT
GOWN STRL REUS W/ TWL LRG LVL3 (GOWN DISPOSABLE) ×1 IMPLANT
GOWN STRL REUS W/ TWL XL LVL3 (GOWN DISPOSABLE) ×1 IMPLANT
GOWN STRL REUS W/TWL LRG LVL3 (GOWN DISPOSABLE) ×3
GOWN STRL REUS W/TWL XL LVL3 (GOWN DISPOSABLE) ×3
GRAFT TISSUE HEMIFEM CONDYLE (Tissue) ×3 IMPLANT
KIT BIOUNI CCA CUTTING SZ L17 (KITS) ×3 IMPLANT
KIT BIOUNI DISP W/DRILL BITS (KITS) ×2 IMPLANT
KIT BONE MRW ASP ANGEL CPRP (KITS) ×2 IMPLANT
KNEE WRAP E Z 3 GEL PACK (MISCELLANEOUS) IMPLANT
MANIFOLD NEPTUNE II (INSTRUMENTS) ×1 IMPLANT
NDL HYPO 18GX1.5 BLUNT FILL (NEEDLE) ×1 IMPLANT
NDL SAFETY ECLIPSE 18X1.5 (NEEDLE) ×2 IMPLANT
NEEDLE HYPO 18GX1.5 BLUNT FILL (NEEDLE) ×3 IMPLANT
NEEDLE HYPO 18GX1.5 SHARP (NEEDLE) ×6
PACK ARTHROSCOPY DSU (CUSTOM PROCEDURE TRAY) ×3 IMPLANT
PACK BASIN DAY SURGERY FS (CUSTOM PROCEDURE TRAY) ×3 IMPLANT
PADDING CAST COTTON 6X4 STRL (CAST SUPPLIES) ×3 IMPLANT
PENCIL SMOKE EVACUATOR (MISCELLANEOUS) IMPLANT
SPONGE LAP 18X18 RF (DISPOSABLE) ×4 IMPLANT
SUCTION FRAZIER HANDLE 10FR (MISCELLANEOUS) ×2
SUCTION TUBE FRAZIER 10FR DISP (MISCELLANEOUS) ×1 IMPLANT
SUT ETHILON 3 0 PS 1 (SUTURE) IMPLANT
SUT MNCRL AB 4-0 PS2 18 (SUTURE) ×2 IMPLANT
SUT VIC AB 0 CT1 27 (SUTURE) ×3
SUT VIC AB 0 CT1 27XCR 8 STRN (SUTURE) ×1 IMPLANT
SUT VIC AB 2-0 SH 27 (SUTURE)
SUT VIC AB 2-0 SH 27XBRD (SUTURE) IMPLANT
SUT VIC AB 3-0 FS2 27 (SUTURE) IMPLANT
SYR 5ML LL (SYRINGE) ×3 IMPLANT
SYR BULB 3OZ (MISCELLANEOUS) ×3 IMPLANT
SYR TB 1ML LL NO SAFETY (SYRINGE) ×3 IMPLANT
TOWEL GREEN STERILE FF (TOWEL DISPOSABLE) ×3 IMPLANT
TRAY DSU PREP LF (CUSTOM PROCEDURE TRAY) ×3 IMPLANT
YANKAUER SUCT BULB TIP NO VENT (SUCTIONS) ×3 IMPLANT

## 2019-07-11 NOTE — Progress Notes (Signed)
Assisted Dr. Foster with right, ultrasound guided, femoral block. Side rails up, monitors on throughout procedure. See vital signs in flow sheet. Tolerated Procedure well. °

## 2019-07-11 NOTE — Discharge Instructions (Signed)

## 2019-07-11 NOTE — Anesthesia Preprocedure Evaluation (Signed)
Anesthesia Evaluation  Patient identified by MRN, date of birth, ID band Patient awake    Reviewed: Allergy & Precautions, NPO status , Patient's Chart, lab work & pertinent test results  Airway Mallampati: II  TM Distance: >3 FB Neck ROM: Full    Dental no notable dental hx. (+) Teeth Intact   Pulmonary asthma ,    Pulmonary exam normal breath sounds clear to auscultation       Cardiovascular negative cardio ROS Normal cardiovascular exam Rhythm:Regular Rate:Normal     Neuro/Psych negative neurological ROS  negative psych ROS   GI/Hepatic negative GI ROS, Neg liver ROS,   Endo/Other  negative endocrine ROS  Renal/GU negative Renal ROS  negative genitourinary   Musculoskeletal Osteochondral defect right knee   Abdominal   Peds  Hematology negative hematology ROS (+)   Anesthesia Other Findings   Reproductive/Obstetrics                             Anesthesia Physical Anesthesia Plan  ASA: II  Anesthesia Plan: General   Post-op Pain Management:  Regional for Post-op pain   Induction: Intravenous  PONV Risk Score and Plan: 3 and Ondansetron, Treatment may vary due to age or medical condition and Midazolam  Airway Management Planned: LMA  Additional Equipment:   Intra-op Plan:   Post-operative Plan: Extubation in OR  Informed Consent: I have reviewed the patients History and Physical, chart, labs and discussed the procedure including the risks, benefits and alternatives for the proposed anesthesia with the patient or authorized representative who has indicated his/her understanding and acceptance.     Dental advisory given  Plan Discussed with: CRNA and Surgeon  Anesthesia Plan Comments:         Anesthesia Quick Evaluation

## 2019-07-11 NOTE — Op Note (Signed)
NAME: Micheal Bentley, Micheal Bentley MEDICAL RECORD ME:[id number] ACCOUNT [id number] DATE OF BIRTH:Sep 08, 2000 FACILITY: [facility] LOCATION: [facility] PHYSICIAN:Sheri Prows Diamantina Providence, MD  OPERATIVE REPORT  DATE OF PROCEDURE:  07/11/2019  PREOPERATIVE DIAGNOSES:  Right knee chondral defect, medial femoral condyle.  POSTOPERATIVE DIAGNOSES:  Right knee chondral defect, medial femoral condyle.  PROCEDURE:   1.  Right knee osteochondral allograft placement for large medial femoral condyle chondral defect. 2.  Iliac crest bone marrow aspirate.  SURGEON:  Cammy Copa, MD  ASSISTANT:  Karenann Cai, PA  INDICATIONS:  The patient is a 18 year old patient who is about six weeks out right knee anterior cruciate ligament reconstruction.  He had partial medial meniscectomy at that time and a large chondral defect measuring about 2.5 x 2.5 cm was present.   This is on the medial femoral condyle.  He presents now for operative management of this lesion after explanation of risks and benefits.  PROCEDURE IN DETAIL:  The patient was brought to the operating room where general anesthetic was induced.  Preoperative antibiotics administered.  Timeout was called.  Right hip and right leg was prescrubbed with alcohol and Betadine, allowed to air dry.   Prep with DuraPrep solution and draped in a sterile manner.  Ioban used to cover both operative fields.  Timeout was called.  A trocar was then inserted into the iliac crest.  Bone marrow aspirate was obtained and it was spun down in order to let the  allograft soak in the bone marrow aspirate to facilitate healing.  After that was done, the small 5 mm incision was irrigated and closed using 2 simple 3-0 nylon sutures.  At this time, the attention was directed towards the knee.  The patient had full  extension and flexion to about 110 degrees.  The graft was stable.  Anterior approach to knee was made after elevating the leg and was exsanguinated with the Esmarch.  Total  tourniquet time 111 minutes at 300 mmHg.  Anterior approach to knee was made.   Skin and subcutaneous tissue were sharply divided.  Median parapatellar approach was made.  There was significant scar tissue within the retropatellar fat pad.  This was excised.  Care was taken to avoid injury to the anterior horns of the medial and  lateral menisci.  The graft was intact.  At this time, the chondral defect was visualized.  It measured about 2.5 x 2.5 or 3 cm.  This was a snowman-shaped type defect.  The Arthrex guide was placed on the defect and sized appropriately.  This size and  guide was then taken to the condyle of the osteochondral allograft.  In a similar position an alignment that osteochondral allograft was utilized.  The osteochondral allograft was held in position with a pin and then using an Arthrex cutter the graft was  cut.  A transverse saw blade cut was made and then the graft site was trimmed with an oscillating saw in order to become the perfect size and sit flush within the sizer.  Once this was achieved, it was placed into the bone graft aspirate which had been  spun down.  This was to facilitate healing of the allograft to the patient's medial femoral condyle.  Concurrently on the recipient side, the defect was precisely marked and the surrounding chondral cartilage was marked and scored.  Then, 2 guide pins  were placed and precise reaming was performed.  The excess reaming was removed and a trial osteochondral allograft was placed into the defect and  found to have good position.  A true osteochondral allograft was then placed into position and had very good  position within the medial femoral condyle.  At this time, after very good press fit was achieved with all surfaces flush, thorough irrigation was performed with about 3 liters of irrigating solution.  Tourniquet was released and bleeding points  encountered controlled using electrocautery.  Skin of the fat pad and the capsule was  anesthetized with a solution of Marcaine, morphine, clonidine.  Next, the joint was thoroughly irrigated again and then closed over a bolster using #1 Vicryl suture  followed by interrupted inverted 0 Vicryl suture, 2-0 Vicryl suture and a 3-0 Monocryl.  Skin edges were also anesthetized using some of the Marcaine, morphine, clonidine.  At this time, Aquacel dressing placed along with Ace wrap and bulky dressing.   The knee was then placed in a knee immobilizer.  The patient will be admitted overnight for IV antibiotics and will be discharged home in the morning, nonweightbearing, but with a CPM machine to facilitate range of motion.  Luke's assistance was required  at all times during the case for retraction, opening and closing limb positioning, graft preparation.  His assistance was of medical necessity.  TN/NUANCE  D:07/11/2019 T:07/11/2019 JOB:[id number]

## 2019-07-11 NOTE — H&P (Signed)
Micheal Bentley is an 18 y.o. male.   Chief Complaint: Right knee pain HPI: Micheal Bentley is a 18 year old patient is about 6 weeks out right knee ACL reconstruction.  Noted to have a substantial chondral defect on his medial femoral condyle at the time of prior surgery.  Presents now for operative management of allograft placement for chondral defect on the medial femoral condyle in the setting of ACL reconstruction as well as partial medial meniscectomy done 6 weeks ago.  Patient has done well to achieve good range of motion and strength in the knee.  Presents now for operative management  Past Medical History:  Diagnosis Date  . Asthma    Not an active issue  . Seasonal allergies     Past Surgical History:  Procedure Laterality Date  . ANTERIOR CRUCIATE LIGAMENT REPAIR Right 05/29/2019   Procedure: RIGHT KNEE RECONSTRUCTION ANTERIOR CRUCIATE LIGAMENT (ACL) WITH HAMSTRING AUTOGRAFT, MENISCAL REPAIR;  Surgeon: Meredith Pel, MD;  Location: [place];  Service: Orthopedics;  Laterality: Right;  . NO PAST SURGERIES      Family History  Problem Relation Age of Onset  . Diabetes Father    Social History:  reports that he has never smoked. He has never used smokeless tobacco. He reports that he does not drink alcohol or use drugs.  Allergies: No Known Allergies  No medications prior to admission.    No results found for this or any previous visit (from the past 48 hour(s)). No results found.  Review of Systems  Musculoskeletal: Positive for joint pain.  All other systems reviewed and are negative.   Blood pressure 114/73, temperature (!) 97.3 F (36.3 C), temperature source Oral, height 5\' 8"  (1.727 m), weight 78.7 kg, SpO2 100 %. Physical Exam  Constitutional: He appears well-developed.  HENT:  Head: Normocephalic.  Eyes: Pupils are equal, round, and reactive to light.  Neck: Normal range of motion.  Cardiovascular: Normal rate.  Respiratory: Effort normal.  Neurological:  He is alert.  Skin: Skin is warm.  Psychiatric: He has a normal mood and affect.  Examination of the right knee demonstrates trace effusion but good range of motion with near full extension and flexion to about 110.  Graft is stable.  All incisions have healed and are intact.  Patient has no calf tenderness to direct palpation with palpable pedal pulses.  Assessment/Plan Impression is chondral defect medial femoral condyle which is large.  Plan is allograft oats type procedure using fresh donor allograft.  Based on the size of this lesion this is the best option for his knee.  Arthrotomy will be required.  All questions answered.  We will also use bone marrow aspirate to facilitate healing of the allograft to the patient's native bone.  Anderson Malta, MD 07/11/2019, 11:25 AM

## 2019-07-11 NOTE — Brief Op Note (Signed)
   07/11/2019  4:06 PM  PATIENT:  Micheal Bentley  18 y.o. male  PRE-OPERATIVE DIAGNOSIS:  right knee chondral defect  POST-OPERATIVE DIAGNOSIS:  right knee chondral defect  PROCEDURE:  Procedure(s): right knee open osteochondral defect placement with bone  marrow aspiration  SURGEON:  Surgeon(s): Meredith Pel, MD  ASSISTANT: Annie Main, PA  ANESTHESIA:   general  EBL: 75 ml    Total I/O In: 1800 [I.V.:1800] Out: 20 [Blood:20]  BLOOD ADMINISTERED: none  DRAINS: none   LOCAL MEDICATIONS USED: Marcaine morphine clonidine  SPECIMEN:  No Specimen  COUNTS:  YES  TOURNIQUET:   Total Tourniquet Time Documented: Thigh (Right) - 111 minutes Total: Thigh (Right) - 111 minutes   DICTATION: .Other Dictation: Dictation Number [id number]  PLAN OF CARE: Admit for overnight observation  PATIENT DISPOSITION:  PACU - hemodynamically stable

## 2019-07-11 NOTE — Anesthesia Procedure Notes (Signed)
Anesthesia Regional Block: Femoral nerve block   Pre-Anesthetic Checklist: ,, timeout performed, Correct Patient, Correct Site, Correct Laterality, Correct Procedure, Correct Position, site marked, Risks and benefits discussed,  Surgical consent,  Pre-op evaluation,  At surgeon's request and post-op pain management  Laterality: Right  Prep: chloraprep       Needles:  Injection technique: Single-shot  Needle Type: Echogenic Stimulator Needle     Needle Length: 9cm  Needle Gauge: 21   Needle insertion depth: 5 cm   Additional Needles:   Procedures:,,,, ultrasound used (permanent image in chart),,,,  Narrative:  Start time: 07/11/2019 11:21 AM End time: 07/11/2019 11:26 AM Injection made incrementally with aspirations every 5 mL.  Performed by: Personally  Anesthesiologist: Josephine Igo, MD  Additional Notes: Timeout performed. Patient sedated. Relevant anatomy ID'd using [place]. Incremental 2-54ml injection of LA with frequent aspiration. Patient tolerated procedure well.        Right Femoral Nerve Block

## 2019-07-11 NOTE — Transfer of Care (Signed)
Immediate Anesthesia Transfer of Care Note  Patient: Micheal Bentley  Procedure(s) Performed: right knee open osteochondral defect placement with bone  marrow aspiration (Right Knee)  Patient Location: PACU  Anesthesia Type:GA combined with regional for post-op pain  Level of Consciousness: awake, alert  and oriented  Airway & Oxygen Therapy: Patient Spontanous Breathing and Patient connected to face mask oxygen  Post-op Assessment: Report given to RN and Post -op Vital signs reviewed and stable  Post vital signs: Reviewed and stable  Last Vitals:  Vitals Value Taken Time  BP 100/44 07/11/19 1520  Temp    Pulse 98 07/11/19 1526  Resp 13 07/11/19 1526  SpO2 97 % 07/11/19 1526  Vitals shown include unvalidated device data.  Last Pain:  Vitals:   07/11/19 1044  TempSrc: Oral  PainSc: 0-No pain         Complications: No apparent anesthesia complications

## 2019-07-11 NOTE — Anesthesia Procedure Notes (Signed)
Procedure Name: LMA Insertion Performed by: Verita Lamb, CRNA Pre-anesthesia Checklist: Patient identified, Emergency Drugs available, Suction available and Patient being monitored Patient Re-evaluated:Patient Re-evaluated prior to induction Oxygen Delivery Method: Circle system utilized Preoxygenation: Pre-oxygenation with 100% oxygen Induction Type: IV induction Ventilation: Mask ventilation without difficulty LMA: LMA inserted LMA Size: 4.0 Tube type: Oral Number of attempts: 1 Placement Confirmation: positive ETCO2 and CO2 detector Tube secured with: Tape Dental Injury: Teeth and Oropharynx as per pre-operative assessment

## 2019-07-12 ENCOUNTER — Other Ambulatory Visit: Payer: Self-pay | Admitting: Surgical

## 2019-07-12 DIAGNOSIS — M25561 Pain in right knee: Secondary | ICD-10-CM | POA: Diagnosis not present

## 2019-07-12 MED ORDER — OXYCODONE HCL 5 MG PO TABS: 5.0000 mg | ORAL_TABLET | ORAL | 0 refills | Status: AC | PRN

## 2019-07-12 MED ORDER — CEFAZOLIN SODIUM-DEXTROSE 2-4 GM/100ML-% IV SOLN
INTRAVENOUS | Status: AC
  Filled 2019-07-12: qty 100

## 2019-07-12 MED ORDER — ASPIRIN EC 81 MG PO TBEC: 81.0000 mg | DELAYED_RELEASE_TABLET | Freq: Two times a day (BID) | ORAL | 0 refills | Status: DC

## 2019-07-12 MED ORDER — METHOCARBAMOL 500 MG PO TABS: 500.0000 mg | ORAL_TABLET | Freq: Three times a day (TID) | ORAL | 0 refills | Status: DC | PRN

## 2019-07-12 MED ORDER — DICLOFENAC SODIUM 50 MG PO TBEC: 50.0000 mg | DELAYED_RELEASE_TABLET | Freq: Two times a day (BID) | ORAL | 0 refills | Status: DC

## 2019-07-12 NOTE — Anesthesia Postprocedure Evaluation (Signed)
Anesthesia Post Note  Patient: Micheal Bentley  Procedure(s) Performed: right knee open osteochondral defect placement with bone  marrow aspiration (Right Knee)     Patient location during evaluation: PACU Anesthesia Type: General Level of consciousness: awake and alert Pain management: pain level controlled Vital Signs Assessment: post-procedure vital signs reviewed and stable Respiratory status: spontaneous breathing, nonlabored ventilation and respiratory function stable Cardiovascular status: blood pressure returned to baseline and stable Postop Assessment: no apparent nausea or vomiting Anesthetic complications: no    Last Vitals:  Vitals:   07/12/19 0555 07/12/19 0715  BP:    Pulse: 83 75  Resp: 16 16  Temp:    SpO2: 98% 97%    Last Pain:  Vitals:   07/12/19 0715  TempSrc:   PainSc: Asleep   Pain Goal:                   Lynda Rainwater

## 2019-07-12 NOTE — Progress Notes (Signed)
Patient doing well this morning 2+ DP pulse, sensation intact through RLE Plan for NWB for ~4 weeks with use of CPM Discharged patient home with Voltaren, Aspirin, Oxycodone, Robaxin Patient will followup with Dr. Marlou Sa in clinic in ~1-2 weeks

## 2019-07-18 ENCOUNTER — Encounter (HOSPITAL_BASED_OUTPATIENT_CLINIC_OR_DEPARTMENT_OTHER): Payer: Self-pay | Admitting: Orthopedic Surgery

## 2019-07-21 ENCOUNTER — Ambulatory Visit (INDEPENDENT_AMBULATORY_CARE_PROVIDER_SITE_OTHER): Payer: Medicaid Other | Admitting: Orthopedic Surgery

## 2019-07-21 ENCOUNTER — Encounter: Payer: Self-pay | Admitting: Orthopedic Surgery

## 2019-07-21 ENCOUNTER — Other Ambulatory Visit: Payer: Self-pay

## 2019-07-21 DIAGNOSIS — S83511D Sprain of anterior cruciate ligament of right knee, subsequent encounter: Secondary | ICD-10-CM

## 2019-07-21 DIAGNOSIS — M958 Other specified acquired deformities of musculoskeletal system: Secondary | ICD-10-CM

## 2019-07-21 NOTE — Progress Notes (Signed)
   Post-Op Visit Note   Patient: Micheal Bentley           Date of Birth: [birth year]           MRN: [id number] Visit Date: 07/21/2019 PCP: [facility]   Assessment & Plan:  Chief Complaint:  Chief Complaint  Patient presents with  . Right Knee - Routine Post Op   Visit Diagnoses:  1. Rupture of anterior cruciate ligament of right knee, subsequent encounter   2. Osteochondral defect of femoral condyle     Plan: Patient is a 18 year old male who presents to the office s/p right knee osteochondral defect allograft placement on 07/11/2019 and ACL reconstruction on 05/29/2019.  Patient states that he is doing well.  He is ambulating nonweightbearing with crutches.  He is able to do multiple straight leg raises with no issue.  He is up to 85 degrees on the CPM machine.  He is not taking any pain medication or aspirin.  Instructed patient to resume taking the aspirin for DVT prophylaxis.  On exam his incision is healing well and he has a mild effusion.  ACL graft is stable on exam.  He has excellent extension to 0 degrees.  However he is lacking flexion only flexing to about 50 degrees.  Patient will begin physical therapy to specifically focus on flexion range of motion.  He will remain nonweightbearing with crutches.  Plan to start partial weightbearing by Christmas.  Patient will follow up in 2 weeks. Negative Homans today.  No calf tenderness to palpation.  Goal is 90 degrees flexion by 2 more weeks. Follow-Up Instructions: No follow-ups on file.   Orders:  Orders Placed This Encounter  Procedures  . Ambulatory referral to Physical Therapy   No orders of the defined types were placed in this encounter.   Imaging: No results found.  PMFS History: Patient Active Problem List   Diagnosis Date Noted  . 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 Asthma    Not an  active issue  . Seasonal allergies     Family History  Problem Relation Age of Onset  . Diabetes Father     Past Surgical History:  Procedure Laterality Date  . ANTERIOR CRUCIATE LIGAMENT REPAIR Right 05/29/2019   Procedure: RIGHT KNEE RECONSTRUCTION ANTERIOR CRUCIATE LIGAMENT (ACL) WITH HAMSTRING AUTOGRAFT, MENISCAL REPAIR;  Surgeon: Meredith Pel, MD;  Location: [place];  Service: Orthopedics;  Laterality: Right;  . NO PAST SURGERIES    . OSTEOCHONDRAL DEFECT REPAIR/RECONSTRUCTION Right 07/11/2019   Procedure: right knee open osteochondral defect placement with bone  marrow aspiration;  Surgeon: Meredith Pel, MD;  Location: [place];  Service: Orthopedics;  Laterality: Right;   Social History   Occupational History  . Not on file  Tobacco Use  . Smoking status: Never Smoker  . Smokeless tobacco: Never Used  Substance and Sexual Activity  . Alcohol use: No  . Drug use: No  . Sexual activity: Not on file

## 2019-07-25 ENCOUNTER — Ambulatory Visit: Payer: Medicaid Other | Attending: Orthopedic Surgery | Admitting: Physical Therapy

## 2019-07-25 ENCOUNTER — Encounter: Payer: Self-pay | Admitting: Physical Therapy

## 2019-07-25 ENCOUNTER — Other Ambulatory Visit: Payer: Self-pay

## 2019-07-25 DIAGNOSIS — R600 Localized edema: Secondary | ICD-10-CM

## 2019-07-25 DIAGNOSIS — R2689 Other abnormalities of gait and mobility: Secondary | ICD-10-CM

## 2019-07-25 DIAGNOSIS — M25661 Stiffness of right knee, not elsewhere classified: Secondary | ICD-10-CM

## 2019-07-25 DIAGNOSIS — M25561 Pain in right knee: Secondary | ICD-10-CM | POA: Diagnosis present

## 2019-07-25 NOTE — Therapy (Signed)
[place], [name], [address] Phone: [phone]   Fax:  [phone]  Physical Therapy Evaluation  Patient Details  Name: Micheal Bentley MRN: [id number] Date of Birth: 07-Feb-2001 Referring Provider (PT): Dr Meredith Pel    Encounter Date: 07/25/2019  PT End of Session - 07/25/19 1538    Visit Number  1    Number of Visits  16    Date for PT Re-Evaluation  09/19/19    Authorization Type  MCD eval    PT Start Time  1330    PT Stop Time  1410    PT Time Calculation (min)  40 min    Activity Tolerance  Patient tolerated treatment well    Behavior During Therapy  [facility] for tasks assessed/performed       Past Medical History:  Diagnosis Date  . Asthma    Not an active issue  . Seasonal allergies     Past Surgical History:  Procedure Laterality Date  . ANTERIOR CRUCIATE LIGAMENT REPAIR Right 05/29/2019   Procedure: RIGHT KNEE RECONSTRUCTION ANTERIOR CRUCIATE LIGAMENT (ACL) WITH HAMSTRING AUTOGRAFT, MENISCAL REPAIR;  Surgeon: Meredith Pel, MD;  Location: [place];  Service: Orthopedics;  Laterality: Right;  . NO PAST SURGERIES    . OSTEOCHONDRAL DEFECT REPAIR/RECONSTRUCTION Right 07/11/2019   Procedure: right knee open osteochondral defect placement with bone  marrow aspiration;  Surgeon: Meredith Pel, MD;  Location: [place];  Service: Orthopedics;  Laterality: Right;    There were no vitals filed for this visit.   Subjective Assessment - 07/25/19 1337    Subjective  Patient was palying Soccer in december when he stopped short and hurt his knee. He was found to havean ACL tear which was repared on 05/29/2019. He then had an osteochondral allograft surgery on 07/11/2019. At this time he is non-weight bearing on the right side. His pain is well controlled.    How long can you stand comfortably?  no limit on crutches    How long can you walk comfortably?  no limit    Diagnostic tests  nothing post op    Patient Stated Goals  run and jog    Currently in Pain?  No/denies    Pain Location  --    Pain Orientation  --    Pain Descriptors / Indicators  --    Pain Type  --         [facility] PT Assessment - 07/25/19 0001      Assessment   Medical Diagnosis  Right ACL hamstring graft/ Right osteochondral allograft     Referring Provider (PT)  Dr Meredith Pel     Onset Date/Surgical Date  --   ACL: 05/29/2019 OCAG: 07/11/2019    Hand Dominance  Right    Next MD Visit  08/04/2019     Prior Therapy  None       Precautions   Precautions  None      Restrictions   Weight Bearing Restrictions  Yes    LLE Weight Bearing  Non weight bearing      Balance Screen   Has the patient fallen in the past 6 months  No    Has the patient had a decrease in activity level because of a fear of falling?   No    Is the patient reluctant to leave their home because of a fear of falling?   No  Home Environment   Additional Comments  5 steps into the hosue.       Prior Function   Level of Independence  Independent    Physicist, medicalVocation  Student    Vocation Requirements  Online     Leisure  Like to get back to jogging and maybe at some point playing soccer       Cognition   Overall Cognitive Status  Within Functional Limits for tasks assessed    Attention  Focused    Focused Attention  Appears intact    Memory  Appears intact    Awareness  Appears intact    Problem Solving  Appears intact      Observation/Other Assessments   Skin Integrity  well healed surgical wound     Focus on Therapeutic Outcomes (FOTO)   mediciad       Sensation   Light Touch  Appears Intact      Coordination   Gross Motor Movements are Fluid and Coordinated  Yes    Fine Motor Movements are Fluid and Coordinated  Yes      ROM / Strength   AROM / PROM / Strength  AROM;PROM;Strength      AROM   Overall AROM Comments  not tested per script       PROM   PROM Assessment Site  Knee     Right/Left Knee  Right    Right Knee Extension  0    Right Knee Flexion  85      Strength   Overall Strength Comments  not tested per script       Palpation   Palpation comment  no unexpected tednerness to palpation       Ambulation/Gait   Gait Comments  NWB using crutches at this time                 Objective measurements completed on examination: See above findings.      [facility] Adult PT Treatment/Exercise - 07/25/19 0001      Exercises   Exercises  Knee/Hip      Knee/Hip Exercises: Stretches   Other Knee/Hip Stretches  heel slide into flexion with strap. Cuing for light stretch. patient advised not to agressivley stretch knee just maintain motion gained at PT.       Manual Therapy   Manual Therapy  Soft tissue mobilization;Passive ROM    Soft tissue mobilization  IASTYM to medial and lateral quad to reduce soft tissue restrictions     Passive ROM  into flexion              PT Education - 07/25/19 1538    Education Details  reviewed paln at this time per prescription    Person(s) Educated  Patient    Methods  Explanation;Demonstration;Tactile cues;Verbal cues    Comprehension  Verbalized understanding;Returned demonstration;Verbal cues required;Tactile cues required       PT Short Term Goals - 07/25/19 1640      PT SHORT TERM GOAL #1   Title  Patient will increase passive knee flexion by 15 degrees    Baseline  85 degrees    Time  3    Period  Weeks    Status  New    Target Date  08/15/19      PT SHORT TERM GOAL #2   Title  Patient will begin partial weight bearing    Baseline  non weight bearing per patient    Time  3  Period  Weeks    Target Date  08/15/19      PT SHORT TERM GOAL #3   Title  Patient will begin exercises per MD recocmendation    Baseline  unable to perfrorm ther-ex      PT SHORT TERM GOAL #4   Title  therapy will estabilish functional long and short term gaosl when able to assess                Plan -  07/25/19 1539    Clinical Impression Statement  Patient is a 18 year old male S/P right ACL hamstring graft repair on 05/29/2019 and S/P osteochndral allograft on 07/11/2019. He is passive range is currently 0-83 degrees. he has improved since his last MD visit. His pain is well controlled. he is NWB until at leadst his next MD visit. Therapy will focus on PROM at this time and progress per MD reccomendation.    Examination-Activity Limitations  Stand;Locomotion Level;Stairs;Squat    Examination-Participation Restrictions  School;Community Activity    Stability/Clinical Decision Making  Stable/Uncomplicated    Clinical Decision Making  Low    Rehab Potential  Excellent    PT Frequency  2x / week    PT Duration  8 weeks    PT Treatment/Interventions  ADLs/Self Care Home Management;Cryotherapy;Electrical Stimulation;Iontophoresis 4mg /ml Dexamethasone;Moist Heat;Ultrasound    PT Next Visit Plan  PROM into flexion per MD order until next MD visit 08/04/2019. At that point we will follow [facility] OATS protocol unless specific instructions given by MD. When protection phase is over for OATS ( generally 12 weeks ) we can then start follwoing SOS ACL protocol again unless MD gives specific instructions.    PT Home Exercise Plan  strap assisted heel slide patient advised not to go past 90 degrees. Patient showed 90 degrees on left leg Patient    Consulted and Agree with Plan of Care  Patient       Patient will benefit from skilled therapeutic intervention in order to improve the following deficits and impairments:  Abnormal gait, Difficulty walking, Pain, Decreased mobility, Decreased strength, Decreased activity tolerance, Decreased range of motion  Visit Diagnosis: Stiffness of right knee, not elsewhere classified  Other abnormalities of gait and mobility  Acute pain of right knee  Localized edema     Problem List Patient Active Problem List   Diagnosis Date Noted  . 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 Locking of right knee 05/02/2019    05/04/2019 PT DPT  07/25/2019, 4:49 PM  [facility] [address] [place], [place], [place] Phone: [phone]   Fax:  [phone]  Name: CASIMER RUSSETT MRN: [name] Date of Birth: September 04, 2000

## 2019-07-31 ENCOUNTER — Other Ambulatory Visit: Payer: Self-pay

## 2019-07-31 ENCOUNTER — Ambulatory Visit: Payer: Medicaid Other | Admitting: Physical Therapy

## 2019-07-31 ENCOUNTER — Encounter: Payer: Self-pay | Admitting: Physical Therapy

## 2019-07-31 DIAGNOSIS — R600 Localized edema: Secondary | ICD-10-CM

## 2019-07-31 DIAGNOSIS — M25661 Stiffness of right knee, not elsewhere classified: Secondary | ICD-10-CM | POA: Diagnosis not present

## 2019-07-31 DIAGNOSIS — M25561 Pain in right knee: Secondary | ICD-10-CM

## 2019-07-31 DIAGNOSIS — R2689 Other abnormalities of gait and mobility: Secondary | ICD-10-CM

## 2019-07-31 NOTE — Therapy (Signed)
[facility] [address] [place], [place], [address] Phone: [phone]   Fax:  [phone]  Physical Therapy Treatment  Patient Details  Name: Micheal Bentley MRN: [id number] Date of Birth: 2001/01/01 Referring Provider (PT): Dr Cammy Copa    Encounter Date: 07/31/2019  PT End of Session - 07/31/19 1113    Visit Number  2    Number of Visits  16    Date for PT Re-Evaluation  09/19/19    Authorization Type  MCD    Authorization Time Period  07/31/2019-09/24/2019    Authorization - Visit Number  1    Authorization - Number of Visits  16    PT Start Time  1051    PT Stop Time  1130    PT Time Calculation (min)  39 min    Activity Tolerance  Patient tolerated treatment well    Behavior During Therapy  [facility] for tasks assessed/performed       Past Medical History:  Diagnosis Date  . Asthma    Not an active issue  . Seasonal allergies     Past Surgical History:  Procedure Laterality Date  . ANTERIOR CRUCIATE LIGAMENT REPAIR Right 05/29/2019   Procedure: RIGHT KNEE RECONSTRUCTION ANTERIOR CRUCIATE LIGAMENT (ACL) WITH HAMSTRING AUTOGRAFT, MENISCAL REPAIR;  Surgeon: Cammy Copa, MD;  [facility];  Service: Orthopedics;  Laterality: Right;  . NO PAST SURGERIES    . OSTEOCHONDRAL DEFECT REPAIR/RECONSTRUCTION Right 07/11/2019   Procedure: right knee open osteochondral defect placement with bone  marrow aspiration;  Surgeon: Cammy Copa, MD;  Location: [facility];  Service: Orthopedics;  Laterality: Right;    There were no vitals filed for this visit.  Subjective Assessment - 07/31/19 1103    Subjective  Patient reports he is doing well and is consistent with exercises.    Currently in Pain?  No/denies         [facility] PT Assessment - 07/31/19 0001      Assessment   Medical Diagnosis  Right ACL hamstring graft/ Right osteochondral allograft     Referring Provider (PT)  Dr Cammy Copa     Next MD Visit  08/04/2019       Restrictions   Weight Bearing Restrictions  Yes    LLE Weight Bearing  Non weight bearing      PROM   PROM Assessment Site  Knee    Right/Left Knee  Right    Right Knee Extension  0    Right Knee Flexion  90                   [facility] Adult PT Treatment/Exercise - 07/31/19 0001      Exercises   Exercises  Knee/Hip      Knee/Hip Exercises: Stretches   Gastroc Stretch  3 reps;30 seconds    Gastroc Stretch Limitations  longsitting with strap    Other Knee/Hip Stretches  Supine heel slide into flexion with strap 10 sec x10    Other Knee/Hip Stretches  Seated knee flexion stretch on edge of table using good leg to pull back 10 sec x10      Manual Therapy   Manual Therapy  Soft tissue mobilization;Passive ROM    Soft tissue mobilization  IASTYM to medial and lateral quad to reduce soft tissue restrictions     Passive ROM  Flexion while supine and seated  PT Education - 07/31/19 1123    Education Details  HEP    Person(s) Educated  Patient    Methods  Explanation;Demonstration;Verbal cues;Handout    Comprehension  Verbalized understanding;Returned demonstration;Verbal cues required;Need further instruction       PT Short Term Goals - 07/25/19 1640      PT SHORT TERM GOAL #1   Title  Patient will increase passive knee flexion by 15 degrees    Baseline  85 degrees    Time  3    Period  Weeks    Status  New    Target Date  08/15/19      PT SHORT TERM GOAL #2   Title  Patient will begin partial weight bearing    Baseline  non weight bearing per patient    Time  3    Period  Weeks    Target Date  08/15/19      PT SHORT TERM GOAL #3   Title  Patient will begin exercises per MD recocmendation    Baseline  unable to perfrorm ther-ex      PT SHORT TERM GOAL #4   Title  therapy will estabilish functional long and short term gaosl when able to assess               Plan - 07/31/19 1114     Clinical Impression Statement  Patient tolerated therapy well. Therapy focused on knee flexion PROM and he is exhibiting improvement in his knee flexion. He did not report any incerased pain but reported knee tightness. He would benefit from continued skilled PT to progress his motion as protocol allows.    PT Treatment/Interventions  ADLs/Self Care Home Management;Cryotherapy;Electrical Stimulation;Iontophoresis 4mg /ml Dexamethasone;Moist Heat;Ultrasound    PT Next Visit Plan  PROM into flexion per MD order until next visit 08/04/2019. At that point we will follow [facility] OATS protocol unless specific instructions given by MD. When protection phase is over for OATS ( generally 12 weeks ) we can then start follwoing SOS ACL protocol again unless MD gives specific instructions.    PT Home Exercise Plan  Supine strap assisted heel slide patient advised not to go past 90 degrees, seated edge of bed knee flexion stretch with good leg, long sitting calf stretch with strap    Consulted and Agree with Plan of Care  Patient       Patient will benefit from skilled therapeutic intervention in order to improve the following deficits and impairments:  Abnormal gait, Difficulty walking, Pain, Decreased mobility, Decreased strength, Decreased activity tolerance, Decreased range of motion  Visit Diagnosis: Stiffness of right knee, not elsewhere classified  Other abnormalities of gait and mobility  Acute pain of right knee  Localized edema     Problem List Patient Active Problem List   Diagnosis Date Noted  . 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 Locking of right knee 05/02/2019    Hilda Blades, PT, DPT, LAT, ATC 07/31/19  11:31 AM Phone: [phone] Fax: [facility] [address] [place], [name], [address] Phone: [phone]   Fax:  [phone]  Name: Micheal Bentley MRN: [id number] Date of Birth:  06-06-2001

## 2019-07-31 NOTE — Patient Instructions (Signed)
Access Code: [email or web address]  URL: [email or web address]  Date: 07/31/2019  Prepared by: Hilda Blades   Exercises Sitting Heel Slide with Towel - 10 reps - 10 seconds hold - 5x daily - 7x weekly Seated Knee Flexion AAROM - 10 reps - 10 seconds hold - 5x daily - 7x weekly Long Sitting Calf Stretch with Strap - 3 reps - 30 seconds hold - 2-3x daily - 7x weekly

## 2019-08-03 ENCOUNTER — Ambulatory Visit: Payer: Medicaid Other | Admitting: Physical Therapy

## 2019-08-03 ENCOUNTER — Other Ambulatory Visit: Payer: Self-pay

## 2019-08-03 ENCOUNTER — Encounter: Payer: Self-pay | Admitting: Physical Therapy

## 2019-08-03 DIAGNOSIS — M25661 Stiffness of right knee, not elsewhere classified: Secondary | ICD-10-CM

## 2019-08-03 DIAGNOSIS — M25561 Pain in right knee: Secondary | ICD-10-CM

## 2019-08-03 DIAGNOSIS — R2689 Other abnormalities of gait and mobility: Secondary | ICD-10-CM

## 2019-08-03 DIAGNOSIS — R600 Localized edema: Secondary | ICD-10-CM

## 2019-08-03 NOTE — Therapy (Signed)
[facility] [address] [place], [place], [address] Phone: [phone]   Fax:  [phone]  Physical Therapy Treatment  Patient Details  Name: Micheal Bentley MRN: [id number] Date of Birth: Sep 15, 2000 Referring Provider (PT): Dr Cammy Copa    Encounter Date: 08/03/2019  PT End of Session - 08/03/19 0853    Visit Number  3    Number of Visits  16    Date for PT Re-Evaluation  09/19/19    Authorization Type  MCD    Authorization Time Period  07/31/2019-09/24/2019    Authorization - Visit Number  2    Authorization - Number of Visits  16    PT Start Time  0834    PT Stop Time  0915    PT Time Calculation (min)  41 min    Activity Tolerance  Patient tolerated treatment well    Behavior During Therapy  [facility] for tasks assessed/performed       Past Medical History:  Diagnosis Date  . Asthma    Not an active issue  . Seasonal allergies     Past Surgical History:  Procedure Laterality Date  . ANTERIOR CRUCIATE LIGAMENT REPAIR Right 05/29/2019   Procedure: RIGHT KNEE RECONSTRUCTION ANTERIOR CRUCIATE LIGAMENT (ACL) WITH HAMSTRING AUTOGRAFT, MENISCAL REPAIR;  Surgeon: Cammy Copa, MD;  [facility];  Service: Orthopedics;  Laterality: Right;  . NO PAST SURGERIES    . OSTEOCHONDRAL DEFECT REPAIR/RECONSTRUCTION Right 07/11/2019   Procedure: right knee open osteochondral defect placement with bone  marrow aspiration;  Surgeon: Cammy Copa, MD;  Location: [facility];  Service: Orthopedics;  Laterality: Right;    There were no vitals filed for this visit.  Subjective Assessment - 08/03/19 0838    Subjective  Patient reports he is doing well and is consistent with exercises. No changes since last visit.    Currently in Pain?  No/denies         [facility] PT Assessment - 08/03/19 0001      PROM   PROM Assessment Site  Knee    Right/Left Knee  Right    Right Knee Extension  0    Right  Knee Flexion  90                   [facility] Adult PT Treatment/Exercise - 08/03/19 0001      Exercises   Exercises  Knee/Hip      Knee/Hip Exercises: Stretches   Quad Stretch  3 reps;30 seconds    Quad Stretch Limitations  Prone with strap    Gastroc Stretch  3 reps;30 seconds    Gastroc Stretch Limitations  longsitting with strap    Other Knee/Hip Stretches  Supine heel slide into flexion with strap 10 sec x10    Other Knee/Hip Stretches  Seated knee flexion stretch on edge of table using good leg to pull back 10 sec x10      Manual Therapy   Manual Therapy  Soft tissue mobilization;Passive ROM    Soft tissue mobilization  IASTYM to mid/distal quad to reduce soft tissue restrictions     Passive ROM  Knee flexion/extension while supine and seated             PT Education - 08/03/19 0852    Education Details  HEP    Person(s) Educated  Patient    Methods  Explanation;Demonstration;Verbal cues    Comprehension  Verbalized understanding;Returned demonstration;Verbal cues required;Need further instruction  PT Short Term Goals - 07/25/19 1640      PT SHORT TERM GOAL #1   Title  Patient will increase passive knee flexion by 15 degrees    Baseline  85 degrees    Time  3    Period  Weeks    Status  New    Target Date  08/15/19      PT SHORT TERM GOAL #2   Title  Patient will begin partial weight bearing    Baseline  non weight bearing per patient    Time  3    Period  Weeks    Target Date  08/15/19      PT SHORT TERM GOAL #3   Title  Patient will begin exercises per MD recocmendation    Baseline  unable to perfrorm ther-ex      PT SHORT TERM GOAL #4   Title  therapy will estabilish functional long and short term gaosl when able to assess               Plan - 08/03/19 0853    Clinical Impression Statement  Patient tolerated therapy well. He continues to do well with his knee flexion PROM. He did not report any increased pain with therapy  which focused primarily on knee flexon PROM. He would benefit from continued skilled PT to progress his motion and return to PLOF.    PT Treatment/Interventions  ADLs/Self Care Home Management;Cryotherapy;Electrical Stimulation;Iontophoresis 4mg /ml Dexamethasone;Moist Heat;Ultrasound    PT Next Visit Plan  PROM into flexion per MD order until next visit 08/04/2019. At that point we will follow [facility] OATS protocol unless specific instructions given by MD. When protection phase is over for OATS ( generally 12 weeks ) we can then start follwoing SOS ACL protocol again unless MD gives specific instructions.    PT Home Exercise Plan  Supine strap assisted heel slide patient advised not to go past 90 degrees, seated edge of bed knee flexion stretch with good leg, long sitting calf stretch with strap    Consulted and Agree with Plan of Care  Patient       Patient will benefit from skilled therapeutic intervention in order to improve the following deficits and impairments:  Abnormal gait, Difficulty walking, Pain, Decreased mobility, Decreased strength, Decreased activity tolerance, Decreased range of motion  Visit Diagnosis: Stiffness of right knee, not elsewhere classified  Other abnormalities of gait and mobility  Acute pain of right knee  Localized edema     Problem List Patient Active Problem List   Diagnosis Date Noted  . 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 Locking of right knee 05/02/2019    Hilda Blades, PT, DPT, LAT, ATC 08/03/19  9:17 AM Phone: [phone] Fax: [facility] [address] [place], [name], [address] Phone: [phone]   Fax:  [phone]  Name: Micheal Bentley MRN: [id number] Date of Birth: 23-Apr-2001

## 2019-08-04 ENCOUNTER — Encounter: Payer: Self-pay | Admitting: Orthopedic Surgery

## 2019-08-04 ENCOUNTER — Ambulatory Visit (INDEPENDENT_AMBULATORY_CARE_PROVIDER_SITE_OTHER): Payer: Medicaid Other | Admitting: Orthopedic Surgery

## 2019-08-04 DIAGNOSIS — S83511D Sprain of anterior cruciate ligament of right knee, subsequent encounter: Secondary | ICD-10-CM

## 2019-08-04 DIAGNOSIS — M958 Other specified acquired deformities of musculoskeletal system: Secondary | ICD-10-CM

## 2019-08-04 MED ORDER — ASPIRIN EC 81 MG PO TBEC: 81.0000 mg | DELAYED_RELEASE_TABLET | Freq: Two times a day (BID) | ORAL | 0 refills | Status: AC

## 2019-08-04 NOTE — Progress Notes (Signed)
   Post-Op Visit Note   Patient: Micheal Bentley           Date of Birth: Oct 22, 2000           MRN: [id number] Visit Date: 08/04/2019 PCP[facility]   Assessment & Plan:  Chief Complaint:  Chief Complaint  Patient presents with  . Right Knee - Follow-up   Visit Diagnoses:  1. Osteochondral defect of femoral condyle   2. Rupture of anterior cruciate ligament of right knee, subsequent encounter     Plan: Patient is a 18 year old male who presents s/p right knee open osteochondral defect allograft placement with bone marrow aspirate on 07/11/2019.  Patient notes that he is doing well with very little pain.  He is continued his nonweightbearing status.  He is doing physical therapy 2 times a week with home exercise program to work on knee flexion.  At his last office visit he was at about 50 degrees of flexion but he is currently at 90 degrees today in the office.  His ACL graft is stable on exam.  He has a small effusion.  Incisions are healing well.  Recommended patient continue physical therapy and home exercise program.  He has run out of aspirin so I will prescribe this for him to prevent blood clots.  Plan for patient to start partial weightbearing with 1 crutch on 08/08/2019.  This will be 4 weeks out from surgical procedure.  If patient is feeling well after partial weightbearing through 08/13/2019, he may progress to full weightbearing on 08/15/2019.  Patient agrees with this plan.  He will follow up with the office in 4 weeks.  Follow-Up Instructions: No follow-ups on file.   Orders:  No orders of the defined types were placed in this encounter.  No orders of the defined types were placed in this encounter.   Imaging: No results found.  PMFS History: Patient Active Problem List   Diagnosis Date Noted  . 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 Asthma     Not an active issue  . Seasonal allergies     Family History  Problem Relation Age of Onset  . Diabetes Father     Past Surgical History:  Procedure Laterality Date  . ANTERIOR CRUCIATE LIGAMENT REPAIR Right 05/29/2019   Procedure: RIGHT KNEE RECONSTRUCTION ANTERIOR CRUCIATE LIGAMENT (ACL) WITH HAMSTRING AUTOGRAFT, MENISCAL REPAIR;  Surgeon: Meredith Pel, MD;  Location: [place];  Service: Orthopedics;  Laterality: Right;  . NO PAST SURGERIES    . OSTEOCHONDRAL DEFECT REPAIR/RECONSTRUCTION Right 07/11/2019   Procedure: right knee open osteochondral defect placement with bone  marrow aspiration;  Surgeon: Meredith Pel, MD;  Location: [place];  Service: Orthopedics;  Laterality: Right;   Social History   Occupational History  . Not on file  Tobacco Use  . Smoking status: Never Smoker  . Smokeless tobacco: Never Used  Substance and Sexual Activity  . Alcohol use: No  . Drug use: No  . Sexual activity: Not on file

## 2019-08-08 ENCOUNTER — Ambulatory Visit: Payer: Medicaid Other | Admitting: Physical Therapy

## 2019-08-08 ENCOUNTER — Encounter: Payer: Self-pay | Admitting: Physical Therapy

## 2019-08-08 ENCOUNTER — Other Ambulatory Visit: Payer: Self-pay

## 2019-08-08 DIAGNOSIS — M25661 Stiffness of right knee, not elsewhere classified: Secondary | ICD-10-CM

## 2019-08-08 DIAGNOSIS — R600 Localized edema: Secondary | ICD-10-CM

## 2019-08-08 DIAGNOSIS — R2689 Other abnormalities of gait and mobility: Secondary | ICD-10-CM

## 2019-08-08 DIAGNOSIS — M25561 Pain in right knee: Secondary | ICD-10-CM

## 2019-08-08 NOTE — Therapy (Signed)
[place], [name], [address] Phone: [phone]   Fax:  [phone]  Physical Therapy Treatment  Patient Details  Name: Micheal Bentley MRN: [id number] Date of Birth: 2000/11/09 Referring Provider (PT): Dr Meredith Pel    Encounter Date: 08/08/2019  PT End of Session - 08/08/19 0834    Visit Number  4    Number of Visits  16    Date for PT Re-Evaluation  09/19/19    Authorization Type  MCD    Authorization Time Period  07/31/2019-09/24/2019    Authorization - Visit Number  3    Authorization - Number of Visits  16    PT Start Time  4656    PT Stop Time  0916    PT Time Calculation (min)  42 min    Activity Tolerance  Patient tolerated treatment well    Behavior During Therapy  [facility] for tasks assessed/performed       Past Medical History:  Diagnosis Date  . Asthma    Not an active issue  . Seasonal allergies     Past Surgical History:  Procedure Laterality Date  . ANTERIOR CRUCIATE LIGAMENT REPAIR Right 05/29/2019   Procedure: RIGHT KNEE RECONSTRUCTION ANTERIOR CRUCIATE LIGAMENT (ACL) WITH HAMSTRING AUTOGRAFT, MENISCAL REPAIR;  Surgeon: Meredith Pel, MD;  Location: [place];  Service: Orthopedics;  Laterality: Right;  . NO PAST SURGERIES    . OSTEOCHONDRAL DEFECT REPAIR/RECONSTRUCTION Right 07/11/2019   Procedure: right knee open osteochondral defect placement with bone  marrow aspiration;  Surgeon: Meredith Pel, MD;  Location: [place];  Service: Orthopedics;  Laterality: Right;    There were no vitals filed for this visit.  Subjective Assessment - 08/08/19 0834    Subjective  Pt is walking PWB with bilat crutches - no pain    Currently in Pain?  No/denies         [facility] PT Assessment - 08/08/19 0001      Assessment   Medical Diagnosis  Right ACL hamstring graft/ Right osteochondral allograft     Referring Provider (PT)  Dr Meredith Pel     Next  MD Visit  09/04/2019      PROM   Right Knee Extension  0    Right Knee Flexion  100                   [facility] Adult PT Treatment/Exercise - 08/08/19 0001      Ambulation/Gait   Gait Comments  PWB with bilat crutches - pt with good pattern and very safe.       Knee/Hip Exercises: Stretches   Passive Hamstring Stretch  Right;2 reps;30 seconds   supine with strap   Quad Stretch  Right;2 reps;30 seconds    Quad Stretch Limitations  Prone with strap      Knee/Hip Exercises: Aerobic   Nustep  L1 x5' U'LE for ROM and very light strength      Knee/Hip Exercises: Seated   Other Seated Knee/Hip Exercises  20 long sit quad sets 3-5 sec hold.       Knee/Hip Exercises: Supine   Heel Slides  AROM;Right;20 reps    Straight Leg Raises  Strengthening;Right;2 sets;10 reps   VC for form   Other Supine Knee/Hip Exercises  20 reps DF and PF with green band      Knee/Hip Exercises: Sidelying   Hip ABduction  Strengthening;AROM;Right;2 sets;10 reps    Hip  ADduction  Strengthening;Right;10 reps      Knee/Hip Exercises: Prone   Straight Leg Raises  Strengthening;Right;2 sets;10 reps             PT Education - 08/08/19 0847    Education Details  HEP progression open chain SLR and crutch walking PWB    Person(s) Educated  Patient    Methods  Explanation;Demonstration;Handout    Comprehension  Verbalized understanding;Returned demonstration       PT Short Term Goals - 08/08/19 0856      PT SHORT TERM GOAL #1   Title  Patient will increase passive knee flexion by 15 degrees    Baseline  100 degrees 08/08/2019    Status  Achieved      PT SHORT TERM GOAL #2   Title  Patient will begin partial weight bearing    Status  Achieved      PT SHORT TERM GOAL #3   Title  Patient will begin exercises per MD recocmendation    Status  Achieved               Plan - 08/08/19 0903    Clinical Impression Statement  Pt saw MD and was progressed to [facility] and can go FWB next week.   He was able to perform open chain hip strengtheing without any pain.  His quad contraction is fair - did improve slightly with reps of quad sets.  Met some STGs.    Rehab Potential  Excellent    PT Frequency  2x / week    PT Duration  8 weeks    PT Treatment/Interventions  ADLs/Self Care Home Management;Cryotherapy;Electrical Stimulation;Iontophoresis 60m/ml Dexamethasone;Moist Heat;Ultrasound    PT Next Visit Plan  assess tolerance to new HEP    Consulted and Agree with Plan of Care  Patient       Patient will benefit from skilled therapeutic intervention in order to improve the following deficits and impairments:  Abnormal gait, Difficulty walking, Pain, Decreased mobility, Decreased strength, Decreased activity tolerance, Decreased range of motion  Visit Diagnosis: Stiffness of right knee, not elsewhere classified  Other abnormalities of gait and mobility  Acute pain of right knee  Localized edema     Problem List Patient Active Problem List   Diagnosis Date Noted  . 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 Locking of right knee 05/02/2019    SJeral PinchPT  08/08/2019, 9:14 AM  [facility] [address]Phone: [phone]  Fax:  [phone] Name: Micheal MEADORMRN: [id number] of Birth: 602/12/2000

## 2019-08-09 ENCOUNTER — Encounter: Payer: Self-pay | Admitting: Physical Therapy

## 2019-08-09 ENCOUNTER — Ambulatory Visit: Payer: Medicaid Other | Admitting: Physical Therapy

## 2019-08-09 DIAGNOSIS — M25561 Pain in right knee: Secondary | ICD-10-CM

## 2019-08-09 DIAGNOSIS — R600 Localized edema: Secondary | ICD-10-CM

## 2019-08-09 DIAGNOSIS — R2689 Other abnormalities of gait and mobility: Secondary | ICD-10-CM

## 2019-08-09 DIAGNOSIS — M25661 Stiffness of right knee, not elsewhere classified: Secondary | ICD-10-CM | POA: Diagnosis not present

## 2019-08-09 NOTE — Therapy (Signed)
[place], [name], [address] Phone: [phone]   Fax:  [phone]  Physical Therapy Treatment  Patient Details  Name: Micheal Bentley MRN: [id number] Date of Birth: 2001-07-18 Referring Provider (PT): Dr Meredith Pel    Encounter Date: 08/09/2019  PT End of Session - 08/09/19 0834    Visit Number  5    Number of Visits  16    Date for PT Re-Evaluation  09/19/19    Authorization Type  MCD    Authorization Time Period  07/31/2019-09/24/2019    Authorization - Visit Number  4    Authorization - Number of Visits  16    PT Start Time  2683    PT Stop Time  0916    PT Time Calculation (min)  42 min    Activity Tolerance  Patient tolerated treatment well    Behavior During Therapy  [facility] for tasks assessed/performed       Past Medical History:  Diagnosis Date  . Asthma    Not an active issue  . Seasonal allergies     Past Surgical History:  Procedure Laterality Date  . ANTERIOR CRUCIATE LIGAMENT REPAIR Right 05/29/2019   Procedure: RIGHT KNEE RECONSTRUCTION ANTERIOR CRUCIATE LIGAMENT (ACL) WITH HAMSTRING AUTOGRAFT, MENISCAL REPAIR;  Surgeon: Meredith Pel, MD;  Location: [place];  Service: Orthopedics;  Laterality: Right;  . NO PAST SURGERIES    . OSTEOCHONDRAL DEFECT REPAIR/RECONSTRUCTION Right 07/11/2019   Procedure: right knee open osteochondral defect placement with bone  marrow aspiration;  Surgeon: Meredith Pel, MD;  Location: [place];  Service: Orthopedics;  Laterality: Right;    There were no vitals filed for this visit.  Subjective Assessment - 08/09/19 0835    Subjective  doing well, no increase in pain from added HEP    Patient Stated Goals  run and jog    Currently in Pain?  No/denies                       [facility] Adult PT Treatment/Exercise - 08/09/19 0001      Ambulation/Gait   Gait Comments  pt amb into clinic with decreased Rt  toe off and knee flexion during swing. Practiced this around the clinic with bilat curtches       Knee/Hip Exercises: Stretches   Personal assistant reps;30 seconds    Quad Stretch Limitations  Prone with strap    Hip Flexor Stretch  Right;2 reps;30 seconds   on side of mat, VC for form.   Piriformis Stretch  Right;20 seconds    Piriformis Stretch Limitations  figure 4      Knee/Hip Exercises: Aerobic   Stationary Bike  L2x5'   some compensatory motion at ankle and hip for revol initally   Nustep  L3x6' U/LE       Knee/Hip Exercises: Standing   Knee Flexion  Strengthening;3 sets;10 reps;Right    Functional Squat  10 reps    Functional Squat Limitations  mini to ~ 45 degrees      Knee/Hip Exercises: Seated   Other Seated Knee/Hip Exercises  2x10 long sit Rt SLR with ER moving  hip into ab/adduction      Knee/Hip Exercises: Supine   Short Arc Quad Sets  Strengthening;Right;20 reps    Short Arc Quad Sets Limitations  5 sec holds, much stronger quad     Bridges  Strengthening;Both;20 reps   with 5  sec hold   Bridges Limitations  VC ro keep thighs parallel      Knee/Hip Exercises: Sidelying   Other Sidelying Knee/Hip Exercises  10 reps each Rt LE - pilates FWD/BWD kicks, CW/CCW, hot potato               PT Short Term Goals - 08/08/19 0856      PT SHORT TERM GOAL #1   Title  Patient will increase passive knee flexion by 15 degrees    Baseline  100 degrees 08/08/2019    Status  Achieved      PT SHORT TERM GOAL #2   Title  Patient will begin partial weight bearing    Status  Achieved      PT SHORT TERM GOAL #3   Title  Patient will begin exercises per MD recocmendation    Status  Achieved               Plan - 08/09/19 0902    Clinical Impression Statement  Micheal Bentley doing well with HEP, demonstrated improved Rt quad contraction as compared to yesterday. Does fatigue still with exercise.  ROM continues to improve    Rehab Potential  Excellent    PT  Frequency  2x / week    PT Duration  8 weeks    PT Treatment/Interventions  ADLs/Self Care Home Management;Cryotherapy;Electrical Stimulation;Iontophoresis 4mg /ml Dexamethasone;Moist Heat;Ultrasound    PT Next Visit Plan  progress per OATs protocol    Consulted and Agree with Plan of Care  Patient       Patient will benefit from skilled therapeutic intervention in order to improve the following deficits and impairments:  Abnormal gait, Difficulty walking, Pain, Decreased mobility, Decreased strength, Decreased activity tolerance, Decreased range of motion  Visit Diagnosis: Stiffness of right knee, not elsewhere classified  Other abnormalities of gait and mobility  Acute pain of right knee  Localized edema     Problem List Patient Active Problem List   Diagnosis Date Noted  . 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 Locking of right knee 05/02/2019    05/04/2019 PT  08/09/2019, 9:15 AM  [facility] [address] [place], [place], [place] Phone: [phone]   Fax:  [phone]  Name: Micheal Bentley MRN: [name] Date of Birth: Oct 06, 2000

## 2019-08-14 ENCOUNTER — Encounter: Payer: Self-pay | Admitting: Physical Therapy

## 2019-08-14 ENCOUNTER — Ambulatory Visit: Payer: Medicaid Other | Admitting: Physical Therapy

## 2019-08-14 ENCOUNTER — Other Ambulatory Visit: Payer: Self-pay

## 2019-08-14 DIAGNOSIS — R2689 Other abnormalities of gait and mobility: Secondary | ICD-10-CM

## 2019-08-14 DIAGNOSIS — M25661 Stiffness of right knee, not elsewhere classified: Secondary | ICD-10-CM | POA: Diagnosis not present

## 2019-08-14 DIAGNOSIS — M25561 Pain in right knee: Secondary | ICD-10-CM

## 2019-08-14 DIAGNOSIS — R600 Localized edema: Secondary | ICD-10-CM

## 2019-08-14 NOTE — Therapy (Signed)
[facility] [address] [place], [place], [address] Phone: [phone]   Fax:  [phone]  Physical Therapy Treatment  Patient Details  Name: Micheal Bentley MRN: [id number] Date of Birth: July 11, 2001 Referring Provider (PT): Dr Cammy Copa    Encounter Date: 08/14/2019  PT End of Session - 08/14/19 0841    Visit Number  6    Number of Visits  16    Date for PT Re-Evaluation  09/19/19    Authorization Type  MCD    Authorization Time Period  07/31/2019-09/24/2019    Authorization - Visit Number  5    Authorization - Number of Visits  16    PT Start Time  0830    PT Stop Time  0910    PT Time Calculation (min)  40 min    Activity Tolerance  Patient tolerated treatment well    Behavior During Therapy  [facility] for tasks assessed/performed       Past Medical History:  Diagnosis Date  . Asthma    Not an active issue  . Seasonal allergies     Past Surgical History:  Procedure Laterality Date  . ANTERIOR CRUCIATE LIGAMENT REPAIR Right 05/29/2019   Procedure: RIGHT KNEE RECONSTRUCTION ANTERIOR CRUCIATE LIGAMENT (ACL) WITH HAMSTRING AUTOGRAFT, MENISCAL REPAIR;  Surgeon: Cammy Copa, MD;  [facility];  Service: Orthopedics;  Laterality: Right;  . NO PAST SURGERIES    . OSTEOCHONDRAL DEFECT REPAIR/RECONSTRUCTION Right 07/11/2019   Procedure: right knee open osteochondral defect placement with bone  marrow aspiration;  Surgeon: Cammy Copa, MD;  Location: [facility];  Service: Orthopedics;  Laterality: Right;    There were no vitals filed for this visit.  Subjective Assessment - 08/14/19 0839    Subjective  Patient reports he is doing well and denies any pain. He started walking without crutches today.    Currently in Pain?  No/denies         [facility] PT Assessment - 08/14/19 0001      PROM   Right Knee Flexion  105                   [facility] Adult PT Treatment/Exercise -  08/14/19 0001      Exercises   Exercises  Knee/Hip      Knee/Hip Exercises: Stretches   Quad Stretch  2 reps;30 seconds    Quad Stretch Limitations  prone with strap    Hip Flexor Stretch  2 reps;30 seconds    Hip Flexor Stretch Limitations  edge of table      Knee/Hip Exercises: Aerobic   Stationary Bike  L1 x8  for knee ROM      Knee/Hip Exercises: Standing   Gait Training  Patient instructed to proper heel-toe progression and knee flexion with gait      Knee/Hip Exercises: Supine   Straight Leg Raises  3 sets;10 reps      Knee/Hip Exercises: Sidelying   Hip ABduction  3 sets;10 reps    Hip ADduction  3 sets;10 reps      Knee/Hip Exercises: Prone   Straight Leg Raises  3 sets;10 reps      Manual Therapy   Manual Therapy  Passive ROM;Soft tissue mobilization    Soft tissue mobilization  IASTYM to mid/distal quad to reduce soft tissue restrictions     Passive ROM  Knee flexion/extension while supine and seated  PT Education - 08/14/19 0839    Education Details  HEP    Person(s) Educated  Patient    Methods  Explanation;Demonstration;Verbal cues;Handout    Comprehension  Verbalized understanding;Returned demonstration;Verbal cues required;Need further instruction       PT Short Term Goals - 08/08/19 0856      PT SHORT TERM GOAL #1   Title  Patient will increase passive knee flexion by 15 degrees    Baseline  100 degrees 08/08/2019    Status  Achieved      PT SHORT TERM GOAL #2   Title  Patient will begin partial weight bearing    Status  Achieved      PT SHORT TERM GOAL #3   Title  Patient will begin exercises per MD recocmendation    Status  Achieved               Plan - 08/14/19 0841    Clinical Impression Statement  Patient is progressing well and ROM is continuing to improve. He exhibits good quad contraction but does demonstrate fatigue with exercises. He would benefit from continued skilled PT to progress as protocol allows and  return to PLOF.    PT Treatment/Interventions  ADLs/Self Care Home Management;Cryotherapy;Electrical Stimulation;Iontophoresis 4mg /ml Dexamethasone;Moist Heat;Manual techniques;Gait training;Therapeutic activities;Therapeutic exercise;Balance training;Neuromuscular re-education;Patient/family education;Dry needling;Passive range of motion;Taping;Joint Manipulations    PT Next Visit Plan  Establish new STG and LTG; progress per OATs protocol    PT Home Exercise Plan  Supine strap assisted heel slide, seated edge of bed knee flexion stretch with good leg assist, long sitting calf stretch with strap, supine hamstring stretch with strap, 4-way SLR    Consulted and Agree with Plan of Care  Patient       Patient will benefit from skilled therapeutic intervention in order to improve the following deficits and impairments:  Abnormal gait, Difficulty walking, Pain, Decreased mobility, Decreased strength, Decreased activity tolerance, Decreased range of motion  Visit Diagnosis: Stiffness of right knee, not elsewhere classified  Other abnormalities of gait and mobility  Acute pain of right knee  Localized edema     Problem List Patient Active Problem List   Diagnosis Date Noted  . 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 Locking of right knee 05/02/2019    Hilda Blades, PT, DPT, LAT, ATC 08/14/19  9:13 AM Phone: [phone] Fax: [facility] [address] [place], [name], [address] Phone: [phone]   Fax:  [phone]  Name: Micheal Bentley MRN: [id number] Date of Birth: 04-06-2001

## 2019-08-14 NOTE — Patient Instructions (Signed)
Access Code: [email or web address]  URL: [email or web address]  Date: 08/14/2019  Prepared by: Hilda Blades   Exercises Supine Hamstring Stretch with Strap - 30 seconds hold - 7x weekly Seated Hamstring Stretch - 30 seconds hold - 7x weekly Supine Active Straight Leg Raise - 10 reps - 3 sets - 7x weekly Sidelying Hip Abduction - 10 reps - 3 sets - 7x weekly Prone Hip Extension - 10 reps - 3 sets - 7x weekly Sidelying Hip Adduction - 10 reps - 3 sets - 7x weekly

## 2019-08-16 ENCOUNTER — Other Ambulatory Visit: Payer: Self-pay

## 2019-08-16 ENCOUNTER — Encounter: Payer: Self-pay | Admitting: Physical Therapy

## 2019-08-16 ENCOUNTER — Ambulatory Visit: Payer: Medicaid Other | Admitting: Physical Therapy

## 2019-08-16 DIAGNOSIS — M25561 Pain in right knee: Secondary | ICD-10-CM

## 2019-08-16 DIAGNOSIS — M25661 Stiffness of right knee, not elsewhere classified: Secondary | ICD-10-CM

## 2019-08-16 DIAGNOSIS — R600 Localized edema: Secondary | ICD-10-CM

## 2019-08-16 DIAGNOSIS — R2689 Other abnormalities of gait and mobility: Secondary | ICD-10-CM

## 2019-08-16 NOTE — Therapy (Signed)
[facility] [address] [place], [place], [address] Phone: [phone]   Fax:  [phone]  Physical Therapy Treatment  Patient Details  Name: Micheal Bentley MRN: [id number] Date of Birth: 2000/10/16 Referring Provider (PT): Dr Cammy Copa    Encounter Date: 08/16/2019  PT End of Session - 08/16/19 1049    Visit Number  7    Number of Visits  16    Date for PT Re-Evaluation  09/19/19    Authorization Type  MCD    Authorization Time Period  07/31/2019-09/24/2019    Authorization - Visit Number  6    Authorization - Number of Visits  16    PT Start Time  1047    PT Stop Time  1129    PT Time Calculation (min)  42 min    Activity Tolerance  Patient tolerated treatment well    Behavior During Therapy  [facility] for tasks assessed/performed       Past Medical History:  Diagnosis Date  . Asthma    Not an active issue  . Seasonal allergies     Past Surgical History:  Procedure Laterality Date  . ANTERIOR CRUCIATE LIGAMENT REPAIR Right 05/29/2019   Procedure: RIGHT KNEE RECONSTRUCTION ANTERIOR CRUCIATE LIGAMENT (ACL) WITH HAMSTRING AUTOGRAFT, MENISCAL REPAIR;  Surgeon: Cammy Copa, MD;  [facility];  Service: Orthopedics;  Laterality: Right;  . NO PAST SURGERIES    . OSTEOCHONDRAL DEFECT REPAIR/RECONSTRUCTION Right 07/11/2019   Procedure: right knee open osteochondral defect placement with bone  marrow aspiration;  Surgeon: Cammy Copa, MD;  Location: [facility];  Service: Orthopedics;  Laterality: Right;    There were no vitals filed for this visit.  Subjective Assessment - 08/16/19 1653    Subjective  Patient reports he is continuing to do well. He denies any pain and states he feels he is improving with motion.    Currently in Pain?  No/denies         [facility] PT Assessment - 08/16/19 0001      PROM   Right Knee Flexion  111                   [facility] Adult PT  Treatment/Exercise - 08/16/19 0001      Neuro Re-ed    Neuro Re-ed Details   SL balance 3x30 seconds - cued to maintain a soft knee and level hips      Exercises   Exercises  Knee/Hip      Knee/Hip Exercises: Stretches   Lobbyist  3 reps;30 seconds    Quad Stretch Limitations  prone with strap    Hip Flexor Stretch  3 reps;30 seconds    Hip Flexor Stretch Limitations  edge of table    Knee: Self-Stretch to increase Flexion  5 reps;10 seconds    Knee: Self-Stretch Limitations  long sitting with strap      Knee/Hip Exercises: Aerobic   Stationary Bike  L2 x5 min  for knee ROM      Knee/Hip Exercises: Standing   Heel Raises  2 sets;15 reps    Functional Squat  3 sets;10 reps    Functional Squat Limitations  mini with counter support      Knee/Hip Exercises: Supine   Straight Leg Raises  2 sets;10 reps      Knee/Hip Exercises: Sidelying   Hip ABduction  2 sets;10 reps    Hip ADduction  2 sets;10 reps  Knee/Hip Exercises: Prone   Straight Leg Raises  2 sets;10 reps      Manual Therapy   Manual Therapy  Passive ROM    Passive ROM  Knee flexion/extension while supine and seated             PT Education - 08/16/19 1049    Education Details  HEP    Person(s) Educated  Patient    Methods  Explanation;Demonstration;Tactile cues;Verbal cues;Handout    Comprehension  Verbalized understanding;Returned demonstration;Verbal cues required;Tactile cues required;Need further instruction       PT Short Term Goals - 08/08/19 0856      PT SHORT TERM GOAL #1   Title  Patient will increase passive knee flexion by 15 degrees    Baseline  100 degrees 08/08/2019    Status  Achieved      PT SHORT TERM GOAL #2   Title  Patient will begin partial weight bearing    Status  Achieved      PT SHORT TERM GOAL #3   Title  Patient will begin exercises per MD recocmendation    Status  Achieved               Plan - 08/16/19 1656    Clinical Impression Statement   Patient continues to exhibit improvement in knee flexion range of motion and quad activation/strengthening. He does require consistent cueing for proper technique and quad activation with exercises, working through full range. He would benefit from continued skilled PT to progress as protocol allows.    PT Treatment/Interventions  ADLs/Self Care Home Management;Cryotherapy;Electrical Stimulation;Iontophoresis 4mg /ml Dexamethasone;Moist Heat;Manual techniques;Gait training;Therapeutic activities;Therapeutic exercise;Balance training;Neuromuscular re-education;Patient/family education;Dry needling;Passive range of motion;Taping;Joint Manipulations    PT Next Visit Plan  Establish new STG and LTG; progress per OATs protocol    PT Home Exercise Plan  Supine strap assisted heel slide, seated edge of bed knee flexion stretch with good leg assist, long sitting calf stretch with strap, supine hamstring stretch with strap, 4-way SLR    Consulted and Agree with Plan of Care  Patient       Patient will benefit from skilled therapeutic intervention in order to improve the following deficits and impairments:  Abnormal gait, Difficulty walking, Pain, Decreased mobility, Decreased strength, Decreased activity tolerance, Decreased range of motion  Visit Diagnosis: Stiffness of right knee, not elsewhere classified  Other abnormalities of gait and mobility  Acute pain of right knee  Localized edema     Problem List Patient Active Problem List   Diagnosis Date Noted  . 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 Locking of right knee 05/02/2019    Hilda Blades, PT, DPT, LAT, ATC 08/16/19  5:03 PM Phone: [phone] Fax: [facility] [address] [place], [name], [address] Phone: [phone]   Fax:  [phone]  Name: Micheal Bentley MRN: [id number] Date of Birth: October 20, 2000

## 2019-08-21 ENCOUNTER — Ambulatory Visit: Payer: Medicaid Other | Admitting: Physical Therapy

## 2019-08-21 ENCOUNTER — Other Ambulatory Visit: Payer: Self-pay

## 2019-08-21 ENCOUNTER — Encounter: Payer: Medicaid Other | Admitting: Physical Therapy

## 2019-08-21 ENCOUNTER — Encounter: Payer: Self-pay | Admitting: Physical Therapy

## 2019-08-21 ENCOUNTER — Ambulatory Visit: Payer: Medicaid Other | Attending: Orthopedic Surgery | Admitting: Physical Therapy

## 2019-08-21 DIAGNOSIS — R600 Localized edema: Secondary | ICD-10-CM | POA: Diagnosis present

## 2019-08-21 DIAGNOSIS — M25561 Pain in right knee: Secondary | ICD-10-CM | POA: Diagnosis present

## 2019-08-21 DIAGNOSIS — R2689 Other abnormalities of gait and mobility: Secondary | ICD-10-CM | POA: Diagnosis present

## 2019-08-21 DIAGNOSIS — M25661 Stiffness of right knee, not elsewhere classified: Secondary | ICD-10-CM | POA: Diagnosis present

## 2019-08-21 NOTE — Therapy (Signed)
[facility], [name], [address] Phone: [phone]   Fax:  [phone]  Physical Therapy Treatment  Patient Details  Name: Micheal Bentley MRN: [id number] Date of Birth: [birth year] Referring Provider (PT): Dr Meredith Pel    Encounter Date: 08/21/2019  PT End of Session - 08/21/19 1211    Visit Number  8    Number of Visits  16    Date for PT Re-Evaluation  09/19/19    Authorization Type  MCD    Authorization Time Period  07/31/2019-09/24/2019    Authorization - Visit Number  7    Authorization - Number of Visits  16    PT Start Time  4315    PT Stop Time  1215    PT Time Calculation (min)  41 min    Activity Tolerance  Patient tolerated treatment well    Behavior During Therapy  [facility] for tasks assessed/performed       Past Medical History:  Diagnosis Date  . Asthma    Not an active issue  . Seasonal allergies     Past Surgical History:  Procedure Laterality Date  . ANTERIOR CRUCIATE LIGAMENT REPAIR Right 05/29/2019   Procedure: RIGHT KNEE RECONSTRUCTION ANTERIOR CRUCIATE LIGAMENT (ACL) WITH HAMSTRING AUTOGRAFT, MENISCAL REPAIR;  Surgeon: Meredith Pel, MD;  Location: [facility];  Service: Orthopedics;  Laterality: Right;  . NO PAST SURGERIES    . OSTEOCHONDRAL DEFECT REPAIR/RECONSTRUCTION Right 07/11/2019   Procedure: right knee open osteochondral defect placement with bone  marrow aspiration;  Surgeon: Meredith Pel, MD;  Location: [place];  Service: Orthopedics;  Laterality: Right;    There were no vitals filed for this visit.  Subjective Assessment - 08/21/19 1210    Subjective  Patient reports he is doing well. No pain reported.    Currently in Pain?  No/denies         [facility] PT Assessment - 08/21/19 0001      PROM   Right Knee Flexion  120   post therapy                  [facility] Adult PT Treatment/Exercise - 08/21/19 0001      Neuro Re-ed     Neuro Re-ed Details   SL balance 3x30 seconds on Airex- cued to maintain a soft knee and level hips      Exercises   Exercises  Knee/Hip      Knee/Hip Exercises: Stretches   Sports administrator  3 reps;30 seconds    Quad Stretch Limitations  prone with strap    Hip Flexor Stretch  3 reps;30 seconds    Hip Flexor Stretch Limitations  edge of table      Knee/Hip Exercises: Aerobic   Stationary Bike  L2 x5 min  for knee ROM      Knee/Hip Exercises: Standing   Heel Raises  3 sets;15 reps    Functional Squat  3 sets;10 reps    Functional Squat Limitations  mini squat with counter support      Knee/Hip Exercises: Supine   Heel Slides  10 reps   with overpressure at end range   Straight Leg Raises  3 sets;10 reps    Straight Leg Raises Limitations  3#      Knee/Hip Exercises: Sidelying   Other Sidelying Knee/Hip Exercises  Side plank on knees with clamshell using green band 3x10 (only left down)      Manual  Therapy   Manual Therapy  Passive ROM;Soft tissue mobilization    Soft tissue mobilization  IASTYM to mid/distal quad to reduce soft tissue restrictions     Passive ROM  Knee flexion/extension while supine and seated             PT Education - 08/21/19 1211    Education Details  HEP    Person(s) Educated  Patient    Methods  Explanation;Demonstration;Tactile cues;Verbal cues    Comprehension  Verbalized understanding;Returned demonstration;Verbal cues required;Tactile cues required;Need further instruction       PT Short Term Goals - 08/08/19 0856      PT SHORT TERM GOAL #1   Title  Patient will increase passive knee flexion by 15 degrees    Baseline  100 degrees 08/08/2019    Status  Achieved      PT SHORT TERM GOAL #2   Title  Patient will begin partial weight bearing    Status  Achieved      PT SHORT TERM GOAL #3   Title  Patient will begin exercises per MD recocmendation    Status  Achieved               Plan - 08/21/19 1214    Clinical  Impression Statement  Patient is progressing well with his knee flexion range of motion and is doing well with the quad and hip strengthening. He tolerated balance progress well and did not report any additional pain with therapy this visit. He would benefit from continued skilled PT to progress as protocol allows.    PT Treatment/Interventions  ADLs/Self Care Home Management;Cryotherapy;Electrical Stimulation;Iontophoresis 4mg /ml Dexamethasone;Moist Heat;Manual techniques;Gait training;Therapeutic activities;Therapeutic exercise;Balance training;Neuromuscular re-education;Patient/family education;Dry needling;Passive range of motion;Taping;Joint Manipulations    PT Next Visit Plan  Establish new STG and LTG; progress per OATs protocol    PT Home Exercise Plan  Supine heel slide with overpressure at end range, prone quad stretch with strap, supine hamstring stretch with strap, 4-way SLR, SL balance    Consulted and Agree with Plan of Care  Patient       Patient will benefit from skilled therapeutic intervention in order to improve the following deficits and impairments:  Abnormal gait, Difficulty walking, Pain, Decreased mobility, Decreased strength, Decreased activity tolerance, Decreased range of motion  Visit Diagnosis: Stiffness of right knee, not elsewhere classified  Other abnormalities of gait and mobility  Localized edema  Acute pain of right knee     Problem List Patient Active Problem List   Diagnosis Date Noted  . 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 Locking of right knee 05/02/2019    05/04/2019, PT, DPT, LAT, ATC 08/21/19  1:41 PM Phone: [phone] Fax: [phone]   [facility] [address] [place], [place], [place] Phone: [phone]   Fax:  [phone]  Name: Micheal Bentley MRN: [name] Date of Birth: 18-Jun-2001

## 2019-08-23 ENCOUNTER — Ambulatory Visit: Payer: Medicaid Other | Admitting: Physical Therapy

## 2019-08-23 ENCOUNTER — Encounter: Payer: Self-pay | Admitting: Physical Therapy

## 2019-08-23 ENCOUNTER — Other Ambulatory Visit: Payer: Self-pay

## 2019-08-23 DIAGNOSIS — R600 Localized edema: Secondary | ICD-10-CM

## 2019-08-23 DIAGNOSIS — M25661 Stiffness of right knee, not elsewhere classified: Secondary | ICD-10-CM | POA: Diagnosis not present

## 2019-08-23 DIAGNOSIS — M25561 Pain in right knee: Secondary | ICD-10-CM

## 2019-08-23 DIAGNOSIS — R2689 Other abnormalities of gait and mobility: Secondary | ICD-10-CM

## 2019-08-23 NOTE — Therapy (Signed)
[facility] [address] [place], [place], [address] Phone: [phone]   Fax:  [phone]  Physical Therapy Treatment  Patient Details  Name: Micheal Bentley MRN: [id number] Date of Birth: 2000-12-27 Referring Provider (PT): Dr Cammy Copa    Encounter Date: 08/23/2019  PT End of Session - 08/23/19 1615    Visit Number  9    Number of Visits  16    Date for PT Re-Evaluation  09/19/19    Authorization Type  MCD    Authorization Time Period  07/31/2019-09/24/2019    Authorization - Visit Number  7    Authorization - Number of Visits  16    PT Start Time  0800    PT Stop Time  0840    PT Time Calculation (min)  40 min    Activity Tolerance  Patient tolerated treatment well    Behavior During Therapy  [facility] for tasks assessed/performed       Past Medical History:  Diagnosis Date  . Asthma    Not an active issue  . Seasonal allergies     Past Surgical History:  Procedure Laterality Date  . ANTERIOR CRUCIATE LIGAMENT REPAIR Right 05/29/2019   Procedure: RIGHT KNEE RECONSTRUCTION ANTERIOR CRUCIATE LIGAMENT (ACL) WITH HAMSTRING AUTOGRAFT, MENISCAL REPAIR;  Surgeon: Cammy Copa, MD;  Location: [facility];  Service: Orthopedics;  Laterality: Right;  . NO PAST SURGERIES    . OSTEOCHONDRAL DEFECT REPAIR/RECONSTRUCTION Right 07/11/2019   Procedure: right knee open osteochondral defect placement with bone  marrow aspiration;  Surgeon: Cammy Copa, MD;  Location: [facility];  Service: Orthopedics;  Laterality: Right;    There were no vitals filed for this visit.  Subjective Assessment - 08/23/19 0808    Subjective  Patient has no complaints. He is having no pain    How long can you stand comfortably?  no limit on crutches    How long can you walk comfortably?  no limit    Diagnostic tests  nothing post op    Patient Stated Goals  run and jog    Currently in Pain?  No/denies    Pain Location   Knee    Pain Orientation  Right    Pain Descriptors / Indicators  Aching    Pain Type  Chronic pain         [facility] PT Assessment - 08/23/19 0001      PROM   Right Knee Flexion  116                   [facility] Adult PT Treatment/Exercise - 08/23/19 0001      Neuro Re-ed    Neuro Re-ed Details   SL balance 3x30 seconds on Airex- cued to maintain a soft knee and level hips      Knee/Hip Exercises: Stretches   Hip Flexor Stretch  3 reps;30 seconds    Hip Flexor Stretch Limitations  edge of table      Knee/Hip Exercises: Aerobic   Stationary Bike  L2 x5 min  for knee ROM      Knee/Hip Exercises: Standing   Heel Raises  3 sets;15 reps    Hip Flexion Limitations  standing slow march 2x10     Lateral Step Up Limitations  2x10 2 inch     Forward Step Up Limitations  2x10 2 inch step     Functional Squat  3 sets;10 reps    Functional Squat Limitations  mini squat with counter support cuing not to go below 60 degrees     Other Standing Knee Exercises  left leg abdcution and extension x15 with right single leg stance       Knee/Hip Exercises: Supine   Straight Leg Raises  3 sets;10 reps    Straight Leg Raises Limitations  3#      Knee/Hip Exercises: Sidelying   Other Sidelying Knee/Hip Exercises  Side plank on knees with clamshell using green band 3x10 (only left down)      Knee/Hip Exercises: Prone   Straight Leg Raises  2 sets;10 reps      Manual Therapy   Soft tissue mobilization  IASTYM to mid/distal quad to reduce soft tissue restrictions     Passive ROM  Knee flexion/extension while supine              PT Education - 08/23/19 1615    Education Details  reviewed symptom mangement    Person(s) Educated  Patient    Methods  Explanation;Demonstration;Tactile cues;Verbal cues    Comprehension  Returned demonstration;Verbalized understanding;Verbal cues required;Tactile cues required;Need further instruction       PT Short Term Goals - 08/08/19 0856       PT SHORT TERM GOAL #1   Title  Patient will increase passive knee flexion by 15 degrees    Baseline  100 degrees 08/08/2019    Status  Achieved      PT SHORT TERM GOAL #2   Title  Patient will begin partial weight bearing    Status  Achieved      PT SHORT TERM GOAL #3   Title  Patient will begin exercises per MD recocmendation    Status  Achieved               Plan - 08/23/19 1616    Clinical Impression Statement  Patient with good tolerance to activity. He had no increase in pain with treatment. Closed chained activity perfromed in limited range to protect graft site. Patients stability is progressing. Therapy will continue to progress per protocol.    Examination-Activity Limitations  Stand;Locomotion Level;Stairs;Squat    Examination-Participation Restrictions  School;Community Activity    Stability/Clinical Decision Making  Stable/Uncomplicated    Rehab Potential  Excellent    PT Frequency  2x / week    PT Duration  8 weeks    PT Treatment/Interventions  ADLs/Self Care Home Management;Cryotherapy;Electrical Stimulation;Iontophoresis 4mg /ml Dexamethasone;Moist Heat;Manual techniques;Gait training;Therapeutic activities;Therapeutic exercise;Balance training;Neuromuscular re-education;Patient/family education;Dry needling;Passive range of motion;Taping;Joint Manipulations    PT Next Visit Plan  Establish new STG and LTG; progress per OATs protocol    PT Home Exercise Plan  Supine heel slide with overpressure at end range, prone quad stretch with strap, supine hamstring stretch with strap, 4-way SLR, SL balance       Patient will benefit from skilled therapeutic intervention in order to improve the following deficits and impairments:  Abnormal gait, Difficulty walking, Pain, Decreased mobility, Decreased strength, Decreased activity tolerance, Decreased range of motion  Visit Diagnosis: Stiffness of right knee, not elsewhere classified  Other abnormalities of gait and  mobility  Localized edema  Acute pain of right knee     Problem List Patient Active Problem List   Diagnosis Date Noted  . 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 Locking of right knee 05/02/2019    Carney Living PT DPT  08/23/2019, 4:21 PM  [place], [name],  [address] Phone: [phone]   Fax:  [phone]  Name: Micheal Bentley MRN: [id number] Date of Birth: 12/16/2000

## 2019-08-29 ENCOUNTER — Other Ambulatory Visit: Payer: Self-pay

## 2019-08-29 ENCOUNTER — Encounter: Payer: Self-pay | Admitting: Physical Therapy

## 2019-08-29 ENCOUNTER — Ambulatory Visit: Payer: Medicaid Other | Admitting: Physical Therapy

## 2019-08-29 DIAGNOSIS — R600 Localized edema: Secondary | ICD-10-CM

## 2019-08-29 DIAGNOSIS — R2689 Other abnormalities of gait and mobility: Secondary | ICD-10-CM

## 2019-08-29 DIAGNOSIS — M25661 Stiffness of right knee, not elsewhere classified: Secondary | ICD-10-CM | POA: Diagnosis not present

## 2019-08-29 DIAGNOSIS — M25561 Pain in right knee: Secondary | ICD-10-CM

## 2019-08-29 NOTE — Therapy (Signed)
[facility] [address] [place], [place], [address] Phone: [phone]   Fax:  [phone]  Physical Therapy Treatment  Patient Details  Name: Micheal Bentley MRN: [id number] Date of Birth: 2000-10-19 Referring Provider (PT): Dr Cammy Copa    Encounter Date: 08/29/2019  PT End of Session - 08/29/19 0906    Visit Number  10    Number of Visits  16    Date for PT Re-Evaluation  09/19/19    Authorization Type  MCD    Authorization Time Period  07/31/2019-09/24/2019    Authorization - Visit Number  7    Authorization - Number of Visits  16    PT Start Time  0900   Patient 15 minutes late for appointment   PT Stop Time  0930    PT Time Calculation (min)  30 min    Activity Tolerance  Patient tolerated treatment well    Behavior During Therapy  [facility] for tasks assessed/performed       Past Medical History:  Diagnosis Date  . Asthma    Not an active issue  . Seasonal allergies     Past Surgical History:  Procedure Laterality Date  . ANTERIOR CRUCIATE LIGAMENT REPAIR Right 05/29/2019   Procedure: RIGHT KNEE RECONSTRUCTION ANTERIOR CRUCIATE LIGAMENT (ACL) WITH HAMSTRING AUTOGRAFT, MENISCAL REPAIR;  Surgeon: Cammy Copa, MD;  Location: [facility];  Service: Orthopedics;  Laterality: Right;  . NO PAST SURGERIES    . OSTEOCHONDRAL DEFECT REPAIR/RECONSTRUCTION Right 07/11/2019   Procedure: right knee open osteochondral defect placement with bone  marrow aspiration;  Surgeon: Cammy Copa, MD;  Location: [facility];  Service: Orthopedics;  Laterality: Right;    There were no vitals filed for this visit.  Subjective Assessment - 08/29/19 0902    Subjective  Patient reports Saturday morning trhe knee buckled a few times. he did some stretching and it worked itself out. He has had no problems since. He is having no pain today.    How long can you stand comfortably?  no limit on crutches    How long  can you walk comfortably?  no limit    Diagnostic tests  nothing post op    Patient Stated Goals  run and jog    Currently in Pain?  No/denies                       [facility] Adult PT Treatment/Exercise - 08/29/19 0001      Knee/Hip Exercises: Standing   Heel Raises Limitations  eccentric on hip macheine x15 and x20     Lateral Step Up Limitations  2x10 4 inch     Forward Step Up Limitations  2x10 4 inch    Functional Squat  3 sets;10 reps    Functional Squat Limitations  mini squat with counter support cuing not to go below 60 degrees     Other Standing Knee Exercises  lateral band walkj 3x10 red     Other Standing Knee Exercises  rebounder 2x20 yellow ball; step onto air-ex x15 fwd and lateral       Knee/Hip Exercises: Supine   Bridges Limitations  bridge on ball x20     Straight Leg Raises  1 set;20 reps    Straight Leg Raises Limitations  3#      Knee/Hip Exercises: Sidelying   Hip ABduction  20 reps    Hip ABduction Limitations  3lbs  Knee/Hip Exercises: Prone   Straight Leg Raises  20 reps    Straight Leg Raises Limitations  3#      Manual Therapy   Passive ROM  held 2nd to shortened appointment and good range per visual inspection              PT Education - 08/29/19 0903    Education Details  reviewed exercises    Person(s) Educated  Patient    Methods  Explanation;Tactile cues;Verbal cues;Demonstration    Comprehension  Verbalized understanding;Returned demonstration;Verbal cues required;Tactile cues required       PT Short Term Goals - 08/29/19 0927      PT SHORT TERM GOAL #1   Title  Patient will increase passive knee flexion by 15 degrees    Baseline  100 degrees 08/08/2019    Time  3    Period  Weeks    Status  Achieved    Target Date  08/15/19      PT SHORT TERM GOAL #2   Title  Patient will begin partial weight bearing    Baseline  full weight bearing    Time  3    Period  Weeks    Status  Achieved    Target Date   08/15/19      PT SHORT TERM GOAL #3   Title  Patient will begin exercises per MD recocmendation    Baseline  unable to perfrorm ther-ex    Time  3    Period  Weeks    Status  Achieved               Plan - 08/29/19 0917    Clinical Impression Statement  Therapy increased difficulty of proprioceptive drills today. He had no increase in pain. Therapy also added eccentric calf rasies. He had a limited session today 2nd to being 15 minutes late but overall he is doing very well. We will continue to progtect the meniscal repair but will continue to advance proprioceptive exercises and quad strengthening wihtin protected ranges. We are currently 7 weeks post op from his OATS.    Examination-Activity Limitations  Stand;Locomotion Level;Stairs;Squat    Examination-Participation Restrictions  School;Community Activity    Stability/Clinical Decision Making  Stable/Uncomplicated    Clinical Decision Making  Low    Rehab Potential  Excellent    PT Frequency  2x / week    PT Duration  8 weeks    PT Treatment/Interventions  ADLs/Self Care Home Management;Cryotherapy;Electrical Stimulation;Iontophoresis 4mg /ml Dexamethasone;Moist Heat;Manual techniques;Gait training;Therapeutic activities;Therapeutic exercise;Balance training;Neuromuscular re-education;Patient/family education;Dry needling;Passive range of motion;Taping;Joint Manipulations    PT Next Visit Plan  Establish new STG and LTG; progress per OATs protocol    PT Home Exercise Plan  Supine heel slide with overpressure at end range, prone quad stretch with strap, supine hamstring stretch with strap, 4-way SLR, SL balance    Consulted and Agree with Plan of Care  Patient       Patient will benefit from skilled therapeutic intervention in order to improve the following deficits and impairments:  Abnormal gait, Difficulty walking, Pain, Decreased mobility, Decreased strength, Decreased activity tolerance, Decreased range of motion  Visit  Diagnosis: Stiffness of right knee, not elsewhere classified  Other abnormalities of gait and mobility  Localized edema  Acute pain of right knee     Problem List Patient Active Problem List   Diagnosis Date Noted  . 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 Locking of right knee 05/02/2019  Dessie Coma PT DPT 08/29/2019, 10:05 AM  [facility] [address] [place], [place], [address] Phone: [phone]   Fax:  [phone]  Name: Micheal Bentley MRN: [id number] Date of Birth: 11-22-2000

## 2019-08-31 ENCOUNTER — Other Ambulatory Visit: Payer: Self-pay

## 2019-08-31 ENCOUNTER — Encounter: Payer: Self-pay | Admitting: Physical Therapy

## 2019-08-31 ENCOUNTER — Ambulatory Visit: Payer: Medicaid Other | Admitting: Physical Therapy

## 2019-08-31 DIAGNOSIS — M25661 Stiffness of right knee, not elsewhere classified: Secondary | ICD-10-CM | POA: Diagnosis not present

## 2019-08-31 DIAGNOSIS — R2689 Other abnormalities of gait and mobility: Secondary | ICD-10-CM

## 2019-08-31 DIAGNOSIS — M25561 Pain in right knee: Secondary | ICD-10-CM

## 2019-08-31 DIAGNOSIS — R600 Localized edema: Secondary | ICD-10-CM

## 2019-09-01 NOTE — Therapy (Signed)
[facility] [address] [place], [place], [address] Phone: [phone]   Fax:  [phone]  Physical Therapy Treatment  Patient Details  Name: Micheal Bentley MRN: [id number] Date of Birth: 2001/06/24 Referring Provider (PT): Dr Cammy Copa    Encounter Date: 08/31/2019  PT End of Session - 08/31/19 0904    Visit Number  11    Number of Visits  16    Date for PT Re-Evaluation  09/19/19    Authorization Type  MCD    Authorization Time Period  07/31/2019-09/24/2019    Authorization - Visit Number  11    Authorization - Number of Visits  16    PT Start Time  0849    PT Stop Time  0930    PT Time Calculation (min)  41 min    Activity Tolerance  Patient tolerated treatment well    Behavior During Therapy  [facility] for tasks assessed/performed       Past Medical History:  Diagnosis Date  . Asthma    Not an active issue  . Seasonal allergies     Past Surgical History:  Procedure Laterality Date  . ANTERIOR CRUCIATE LIGAMENT REPAIR Right 05/29/2019   Procedure: RIGHT KNEE RECONSTRUCTION ANTERIOR CRUCIATE LIGAMENT (ACL) WITH HAMSTRING AUTOGRAFT, MENISCAL REPAIR;  Surgeon: Cammy Copa, MD;  [facility]: [facility];  Service: Orthopedics;  Laterality: Right;  . NO PAST SURGERIES    . OSTEOCHONDRAL DEFECT REPAIR/RECONSTRUCTION Right 07/11/2019   Procedure: right knee open osteochondral defect placement with bone  marrow aspiration;  Surgeon: Cammy Copa, MD;  Location: [facility];  Service: Orthopedics;  Laterality: Right;    There were no vitals filed for this visit.  Subjective Assessment - 08/31/19 0855    Subjective  Patient had no swelling after the last visit. He reports he is doing well.    How long can you stand comfortably?  no limit on crutches    How long can you walk comfortably?  no limit    Diagnostic tests  nothing post op    Patient Stated Goals  run and jog    Currently in Pain?   No/denies                       [facility] Adult PT Treatment/Exercise - 09/01/19 0001      Knee/Hip Exercises: Standing   Heel Raises Limitations  eccentric on hip macheine x15 and x20     Lateral Step Up Limitations  2x10 4 inch     Forward Step Up Limitations  2x10 4 inch    Functional Squat  3 sets;10 reps    Functional Squat Limitations  mini squat with counter support cuing not to go below 60 degrees     Other Standing Knee Exercises  lateral band walk 3x10 red; cone drill to counter 2x10 each arm; pallof press 2x10 25 lbs     Other Standing Knee Exercises  rebounder 2x20 yellow ball; step onto air-ex x15 fwd and lateral       Knee/Hip Exercises: Supine   Bridges Limitations  bridge on ball x20     Straight Leg Raises  1 set;20 reps    Straight Leg Raises Limitations  3#      Knee/Hip Exercises: Sidelying   Hip ABduction  20 reps    Hip ABduction Limitations  3lbs       Knee/Hip Exercises: Prone   Straight Leg Raises  20 reps    Straight Leg Raises Limitations  3#      Manual Therapy   Passive ROM  gentle PROM into flexion              PT Education - 08/31/19 0856    Education Details  HEP and symptom mangement    Person(s) Educated  Patient    Methods  Explanation;Demonstration;Tactile cues;Verbal cues    Comprehension  Verbalized understanding;Returned demonstration;Verbal cues required;Tactile cues required       PT Short Term Goals - 08/31/19 0921      PT SHORT TERM GOAL #1   Title  Patient will increase passive knee flexion by 15 degrees    Baseline  116 degrees 08/30/2018    Time  3    Period  Weeks    Status  Achieved    Target Date  08/15/19      PT SHORT TERM GOAL #2   Title  Patient will begin partial weight bearing    Baseline  full weight bearing without difficulty    Time  3    Period  Weeks    Status  Achieved    Target Date  08/15/19      PT SHORT TERM GOAL #3   Title  Patient will begin exercises per MD recocmendation     Baseline  unable to perfrorm ther-ex    Time  3    Period  Weeks    Status  Achieved      PT SHORT TERM GOAL #4   Title  therapy will estabilish functional long and short term gaosl when able to assess    Period  Weeks               Plan - 08/31/19 0938    Clinical Impression Statement  Patient continues to make greatprogress. he is tolerating higher level propricetion exercises. He continues to focus on low range closed chained strengthening exercises. His range has improved to 116. He is still lacking about 14 degrees compared to the left. He report no pain and swelling at this time. Therapy will continue to progress patient per [facility] OATS protocol unless specifically advised otherwise by MD.Today therapy added cone drill and pallof press to improve rotational stability. No increase in pain.    Examination-Activity Limitations  Stand;Locomotion Level;Stairs;Squat    Examination-Participation Restrictions  School;Community Activity    Stability/Clinical Decision Making  Stable/Uncomplicated    Clinical Decision Making  Low    Rehab Potential  Excellent    PT Frequency  2x / week    PT Duration  8 weeks    PT Treatment/Interventions  ADLs/Self Care Home Management;Cryotherapy;Electrical Stimulation;Iontophoresis 4mg /ml Dexamethasone;Moist Heat;Manual techniques;Gait training;Therapeutic activities;Therapeutic exercise;Balance training;Neuromuscular re-education;Patient/family education;Dry needling;Passive range of motion;Taping;Joint Manipulations    PT Next Visit Plan  Establish new STG and LTG; progress per OATs protocol; continue to progress closed chain low range erxercises and propriception/ external focus    PT Home Exercise Plan  Supine heel slide with overpressure at end range, prone quad stretch with strap, supine hamstring stretch with strap, 4-way SLR, SL balance    Consulted and Agree with Plan of Care  Patient       Patient will benefit from skilled therapeutic  intervention in order to improve the following deficits and impairments:  Abnormal gait, Difficulty walking, Pain, Decreased mobility, Decreased strength, Decreased activity tolerance, Decreased range of motion  Visit Diagnosis: Stiffness of right knee, not elsewhere classified  Other abnormalities of  gait and mobility  Localized edema  Acute pain of right knee     Problem List Patient Active Problem List   Diagnosis Date Noted  . 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 Locking of right knee 05/02/2019    Carney Living PT DPT  09/01/2019, 8:28 AM  [facility] [address] [place], [name], [address] Phone: [phone]   Fax:  [phone]  Name: TRAVORIS BUSHEY MRN: [id number] Date of Birth: March 22, 2001

## 2019-09-04 ENCOUNTER — Encounter: Payer: Self-pay | Admitting: Orthopedic Surgery

## 2019-09-04 ENCOUNTER — Ambulatory Visit (INDEPENDENT_AMBULATORY_CARE_PROVIDER_SITE_OTHER): Payer: Medicaid Other | Admitting: Orthopedic Surgery

## 2019-09-04 DIAGNOSIS — M958 Other specified acquired deformities of musculoskeletal system: Secondary | ICD-10-CM

## 2019-09-04 DIAGNOSIS — S83511D Sprain of anterior cruciate ligament of right knee, subsequent encounter: Secondary | ICD-10-CM

## 2019-09-05 ENCOUNTER — Ambulatory Visit: Payer: Medicaid Other | Admitting: Physical Therapy

## 2019-09-05 ENCOUNTER — Encounter: Payer: Self-pay | Admitting: Physical Therapy

## 2019-09-05 ENCOUNTER — Other Ambulatory Visit: Payer: Self-pay

## 2019-09-05 DIAGNOSIS — R600 Localized edema: Secondary | ICD-10-CM

## 2019-09-05 DIAGNOSIS — R2689 Other abnormalities of gait and mobility: Secondary | ICD-10-CM

## 2019-09-05 DIAGNOSIS — M25561 Pain in right knee: Secondary | ICD-10-CM

## 2019-09-05 DIAGNOSIS — M25661 Stiffness of right knee, not elsewhere classified: Secondary | ICD-10-CM | POA: Diagnosis not present

## 2019-09-06 ENCOUNTER — Encounter: Payer: Self-pay | Admitting: Physical Therapy

## 2019-09-06 NOTE — Therapy (Signed)
[place], [name], [address] Phone: [phone]   Fax:  [phone]  Physical Therapy Treatment  Patient Details  Name: Micheal Bentley MRN: [id number] Date of Birth: Oct 05, 2000 Referring Provider (PT): Dr Meredith Pel    Encounter Date: 09/05/2019  PT End of Session - 09/05/19 0928    Visit Number  12    Number of Visits  16    Date for PT Re-Evaluation  09/19/19    Authorization Type  MCD    PT Start Time  [phone]   Patient was 8 minutes late for appointment   PT Stop Time  0931    PT Time Calculation (min)  38 min    Activity Tolerance  Patient tolerated treatment well    Behavior During Therapy  [facility] for tasks assessed/performed       Past Medical History:  Diagnosis Date  . Asthma    Not an active issue  . Seasonal allergies     Past Surgical History:  Procedure Laterality Date  . ANTERIOR CRUCIATE LIGAMENT REPAIR Right 05/29/2019   Procedure: RIGHT KNEE RECONSTRUCTION ANTERIOR CRUCIATE LIGAMENT (ACL) WITH HAMSTRING AUTOGRAFT, MENISCAL REPAIR;  Surgeon: Meredith Pel, MD;  Location: [place];  Service: Orthopedics;  Laterality: Right;  . NO PAST SURGERIES    . OSTEOCHONDRAL DEFECT REPAIR/RECONSTRUCTION Right 07/11/2019   Procedure: right knee open osteochondral defect placement with bone  marrow aspiration;  Surgeon: Meredith Pel, MD;  Location: [facility];  Service: Orthopedics;  Laterality: Right;    There were no vitals filed for this visit.  Subjective Assessment - 09/05/19 0926    Subjective  Patient has been to the mD. The MD note is not in the computer yet but he has been advised to continue doing what he is doing. He continues to have no pain with ttreatment.    How long can you stand comfortably?  no limit on crutches    How long can you walk comfortably?  no limit    Diagnostic tests  nothing post op    Currently in Pain?  No/denies                        [facility] Adult PT Treatment/Exercise - 09/06/19 0001      Knee/Hip Exercises: Stretches   Active Hamstring Stretch Limitations  3x20 sec hold bilateral       Knee/Hip Exercises: Standing   Heel Raises Limitations  eccentric on hip macheine x15 and x20     Lateral Step Up Limitations  2x10 6 inch     Forward Step Up Limitations  2x10 6 inch    Functional Squat  3 sets;10 reps    Functional Squat Limitations  mini squat with counter support cuing not to go below 60 degrees     Other Standing Knee Exercises  ; cone drill to counter 2x10 each arm; pallof press 2x10 25 lbs chops 25 lbs in low range; cable walk 7.5 fwd and back 5x each     Other Standing Knee Exercises  rebounder 2x20 yellow ball; step onto air-ex x15 fwd and lateral       Knee/Hip Exercises: Supine   Bridges Limitations  bridge on ball x20     Straight Leg Raises  1 set;20 reps    Straight Leg Raises Limitations  3#      Knee/Hip Exercises: Sidelying   Hip ABduction  20 reps  Hip ABduction Limitations  3lbs       Knee/Hip Exercises: Prone   Straight Leg Raises  20 reps    Straight Leg Raises Limitations  3#      Manual Therapy   Passive ROM  gentle PROM into flexion              PT Education - 09/05/19 0927    Education Details  reviewed new ther-ex    Person(s) Educated  Patient    Methods  Explanation;Demonstration;Tactile cues;Verbal cues    Comprehension  Verbalized understanding;Verbal cues required;Tactile cues required;Returned demonstration       PT Short Term Goals - 08/31/19 0921      PT SHORT TERM GOAL #1   Title  Patient will increase passive knee flexion by 15 degrees    Baseline  116 degrees 08/30/2018    Time  3    Period  Weeks    Status  Achieved    Target Date  08/15/19      PT SHORT TERM GOAL #2   Title  Patient will begin partial weight bearing    Baseline  full weight bearing without difficulty    Time  3    Period  Weeks    Status   Achieved    Target Date  08/15/19      PT SHORT TERM GOAL #3   Title  Patient will begin exercises per MD recocmendation    Baseline  unable to perfrorm ther-ex    Time  3    Period  Weeks    Status  Achieved      PT SHORT TERM GOAL #4   Title  therapy will estabilish functional long and short term gaosl when able to assess    Period  Weeks               Plan - 09/06/19 1030    Clinical Impression Statement  Patient is making good progress. He had no increase in pain. Therapy added low range chops and cable walks. he had no increase in pain.    Examination-Activity Limitations  Stand;Locomotion Level;Stairs;Squat    Examination-Participation Restrictions  School;Community Activity    Stability/Clinical Decision Making  Stable/Uncomplicated    Clinical Decision Making  Low    Rehab Potential  Excellent    PT Frequency  2x / week    PT Duration  8 weeks    PT Treatment/Interventions  ADLs/Self Care Home Management;Cryotherapy;Electrical Stimulation;Iontophoresis 4mg /ml Dexamethasone;Moist Heat;Manual techniques;Gait training;Therapeutic activities;Therapeutic exercise;Balance training;Neuromuscular re-education;Patient/family education;Dry needling;Passive range of motion;Taping;Joint Manipulations    PT Next Visit Plan  Establish new STG and LTG; progress per OATs protocol; continue to progress closed chain low range erxercises and propriception/ external focus    PT Home Exercise Plan  Supine heel slide with overpressure at end range, prone quad stretch with strap, supine hamstring stretch with strap, 4-way SLR, SL balance    Consulted and Agree with Plan of Care  Patient       Patient will benefit from skilled therapeutic intervention in order to improve the following deficits and impairments:  Abnormal gait, Difficulty walking, Pain, Decreased mobility, Decreased strength, Decreased activity tolerance, Decreased range of motion  Visit Diagnosis: Stiffness of right knee,  not elsewhere classified  Other abnormalities of gait and mobility  Localized edema  Acute pain of right knee     Problem List Patient Active Problem List   Diagnosis Date Noted  . 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  Locking of right knee 05/02/2019    Dessie Coma PT DPT 09/06/2019, 10:35 AM  [facility] [address] [place], [place], [address] Phone: [phone]   Fax:  [phone]  Name: Micheal Bentley MRN: [id number] Date of Birth: Oct 24, 2000

## 2019-09-07 ENCOUNTER — Ambulatory Visit: Payer: Medicaid Other | Admitting: Physical Therapy

## 2019-09-07 ENCOUNTER — Other Ambulatory Visit: Payer: Self-pay

## 2019-09-07 ENCOUNTER — Encounter: Payer: Self-pay | Admitting: Physical Therapy

## 2019-09-07 DIAGNOSIS — M25661 Stiffness of right knee, not elsewhere classified: Secondary | ICD-10-CM

## 2019-09-07 DIAGNOSIS — M25561 Pain in right knee: Secondary | ICD-10-CM

## 2019-09-07 DIAGNOSIS — R2689 Other abnormalities of gait and mobility: Secondary | ICD-10-CM

## 2019-09-07 DIAGNOSIS — R600 Localized edema: Secondary | ICD-10-CM

## 2019-09-07 NOTE — Therapy (Signed)
[place], [name], [address] Phone: [phone]   Fax:  [phone]  Physical Therapy Treatment  Patient Details  Name: Micheal Bentley MRN: [id number] Date of Birth: [birth year] Referring Provider (PT): Dr Micheal Bentley    Encounter Date: 09/07/2019  PT End of Session - 09/07/19 1605    Visit Number  3    Number of Visits  16    Date for PT Re-Evaluation  09/19/19    Authorization Type  MCD    Authorization Time Period  07/31/2019-09/24/2019    Authorization - Visit Number  13    Authorization - Number of Visits  16    PT Start Time  0850   Patient 5 minutes late   PT Stop Time  0930    PT Time Calculation (min)  40 min    Activity Tolerance  Patient tolerated treatment well    Behavior During Therapy  [facility] for tasks assessed/performed       Past Medical History:  Diagnosis Date  . Asthma    Not an active issue  . Seasonal allergies     Past Surgical History:  Procedure Laterality Date  . ANTERIOR CRUCIATE LIGAMENT REPAIR Right 05/29/2019   Procedure: RIGHT KNEE RECONSTRUCTION ANTERIOR CRUCIATE LIGAMENT (ACL) WITH HAMSTRING AUTOGRAFT, MENISCAL REPAIR;  Surgeon: Micheal Pel, MD;  Location: [facility];  Service: Orthopedics;  Laterality: Right;  . NO PAST SURGERIES    . OSTEOCHONDRAL DEFECT REPAIR/RECONSTRUCTION Right 07/11/2019   Procedure: right knee open osteochondral defect placement with bone  marrow aspiration;  Surgeon: Micheal Pel, MD;  Location: [place];  Service: Orthopedics;  Laterality: Right;    There were no vitals filed for this visit.  Subjective Assessment - 09/07/19 1601    Subjective  Patient has no complaints. He had no pain or swelling after the last visit.    How long can you stand comfortably?  no limit on crutches    How long can you walk comfortably?  no limit    Diagnostic tests  nothing post op    Patient Stated Goals  run and jog     Currently in Pain?  No/denies                       [facility] Adult PT Treatment/Exercise - 09/07/19 0001      Knee/Hip Exercises: Stretches   Active Hamstring Stretch Limitations  3x20 sec hold bilateral       Knee/Hip Exercises: Standing   Heel Raises Limitations  eccentric on hip macheine x15 and x20     Lateral Step Up Limitations  2x10 6 inch     Forward Step Up Limitations  2x10 6 inch    Functional Squat  3 sets;10 reps    Functional Squat Limitations  mini squat with counter support cuing not to go below 60 degrees     Other Standing Knee Exercises  ; cone drill to counter 2x10 each arm; pallof press 2x10 25 lbs chops 25 lbs in low range; cable walk 7.5 fwd and back 5x each     Other Standing Knee Exercises  rebounder 2x20 yellow ball; d2 kettle bell 2x10; kettle bell swing 15 x20; kettle bell squat 0-60 x20 25 lb      Knee/Hip Exercises: Supine   Bridges Limitations  bridge on ball x20     Straight Leg Raises  1 set;20 reps    Straight  Leg Raises Limitations  3#      Knee/Hip Exercises: Sidelying   Hip ABduction  20 reps    Hip ABduction Limitations  3lbs       Knee/Hip Exercises: Prone   Straight Leg Raises  20 reps    Straight Leg Raises Limitations  3#      Manual Therapy   Passive ROM  gentle PROM into flexion              PT Education - 09/07/19 1602    Education Details  sy,mptom mangement    Person(s) Educated  Patient    Methods  Explanation;Demonstration    Comprehension  Verbalized understanding;Returned demonstration       PT Short Term Goals - 09/07/19 1609      PT SHORT TERM GOAL #1   Title  Patient will increase passive knee flexion by 15 degrees    Baseline  116 degrees 08/30/2018    Time  3    Period  Weeks    Status  Achieved    Target Date  08/15/19      PT SHORT TERM GOAL #2   Title  Patient will begin partial weight bearing    Baseline  full weight bearing without difficulty    Time  3    Period  Weeks     Status  Achieved    Target Date  08/15/19      PT SHORT TERM GOAL #3   Title  Patient will begin exercises per MD recocmendation    Baseline  unable to perfrorm ther-ex    Time  3    Period  Weeks    Status  Achieved               Plan - 09/07/19 1607    Clinical Impression Statement  Thersapy added kettle bell sqings and weighted squats betweeen 0-60. He continues to have no pain. he had some quad fatigue as was expected. Therapy will continue to progress as tolerated.    Examination-Activity Limitations  Stand;Locomotion Level;Stairs;Squat    Examination-Participation Restrictions  School;Community Activity    Stability/Clinical Decision Making  Stable/Uncomplicated    Clinical Decision Making  Low    Rehab Potential  Excellent    PT Frequency  2x / week    PT Duration  8 weeks    PT Treatment/Interventions  ADLs/Self Care Home Management;Cryotherapy;Electrical Stimulation;Iontophoresis 4mg /ml Dexamethasone;Moist Heat;Manual techniques;Gait training;Therapeutic activities;Therapeutic exercise;Balance training;Neuromuscular re-education;Patient/family education;Dry needling;Passive range of motion;Taping;Joint Manipulations    PT Next Visit Plan  Establish new STG and LTG; progress per OATs protocol; continue to progress closed chain low range erxercises and propriception/ external focus    PT Home Exercise Plan  Supine heel slide with overpressure at end range, prone quad stretch with strap, supine hamstring stretch with strap, 4-way SLR, SL balance    Consulted and Agree with Plan of Care  Patient       Patient will benefit from skilled therapeutic intervention in order to improve the following deficits and impairments:  Abnormal gait, Difficulty walking, Pain, Decreased mobility, Decreased strength, Decreased activity tolerance, Decreased range of motion  Visit Diagnosis: Stiffness of right knee, not elsewhere classified  Other abnormalities of gait and  mobility  Localized edema  Acute pain of right knee     Problem List Patient Active Problem List   Diagnosis Date Noted  . 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 Locking of right knee 05/02/2019     05/04/2019  Micheal Bentley  PT DPT  09/07/2019, 4:11 PM  [facility] [address] [place], [place], [address] Phone: [phone]   Fax:  [phone]  Name: Micheal Bentley MRN: [id number] Date of Birth: 12-26-2000

## 2019-09-08 ENCOUNTER — Encounter: Payer: Self-pay | Admitting: Orthopedic Surgery

## 2019-09-08 NOTE — Progress Notes (Signed)
   Post-Op Visit Note   Patient: Micheal Bentley           Date of Birth: 03-03-2001           MRN: [id number] Visit Date: 09/04/2019 PCP: [facility]   Assessment & Plan:  Chief Complaint:  Chief Complaint  Patient presents with  . Right Knee - Pain   Visit Diagnoses:  1. Rupture of anterior cruciate ligament of right knee, subsequent encounter   2. Osteochondral defect of femoral condyle     Plan: Patient is a 19 year old male who presents s/p right knee open osteochondral defect allograft placement on 07/11/2019 with previous ACL reconstruction about 1 month prior to that.  Overall patient is doing well.  He notes occasional weakness and giving way of the leg but this only happens about 2-3 times a week.  He denies any pain.  He is going to physical therapy 2 times a week with a working on strengthening and balance work with some stationary bike as well.  On exam he has excellent range of motion with 0 degrees of extension to 120 degrees of flexion.  He has no effusion.  He has discontinued aspirin.  He has not returned to any sporting activities and does not have any calf tenderness on exam.  He does have some quad atrophy but his ACL graft remained stable and his incisions are healing well.  Plan for patient to follow-up in 2 months.  Follow-Up Instructions: No follow-ups on file.   Orders:  No orders of the defined types were placed in this encounter.  No orders of the defined types were placed in this encounter.   Imaging: No results found.  PMFS History: Patient Active Problem List   Diagnosis Date Noted  . 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 Asthma    Not an active issue  . Seasonal allergies     Family History  Problem Relation Age of Onset  . Diabetes Father     Past Surgical History:  Procedure Laterality Date  . ANTERIOR CRUCIATE  LIGAMENT REPAIR Right 05/29/2019   Procedure: RIGHT KNEE RECONSTRUCTION ANTERIOR CRUCIATE LIGAMENT (ACL) WITH HAMSTRING AUTOGRAFT, MENISCAL REPAIR;  Surgeon: Cammy Copa, MD;  Location: [facility];  Service: Orthopedics;  Laterality: Right;  . NO PAST SURGERIES    . OSTEOCHONDRAL DEFECT REPAIR/RECONSTRUCTION Right 07/11/2019   Procedure: right knee open osteochondral defect placement with bone  marrow aspiration;  Surgeon: Cammy Copa, MD;  Location: [facility];  Service: Orthopedics;  Laterality: Right;   Social History   Occupational History  . Not on file  Tobacco Use  . Smoking status: Never Smoker  . Smokeless tobacco: Never Used  Substance and Sexual Activity  . Alcohol use: No  . Drug use: No  . Sexual activity: Not on file

## 2019-09-12 ENCOUNTER — Encounter: Payer: Self-pay | Admitting: Physical Therapy

## 2019-09-12 ENCOUNTER — Other Ambulatory Visit: Payer: Self-pay

## 2019-09-12 ENCOUNTER — Ambulatory Visit: Payer: Medicaid Other | Admitting: Physical Therapy

## 2019-09-12 DIAGNOSIS — M25661 Stiffness of right knee, not elsewhere classified: Secondary | ICD-10-CM | POA: Diagnosis not present

## 2019-09-12 DIAGNOSIS — M25561 Pain in right knee: Secondary | ICD-10-CM

## 2019-09-12 DIAGNOSIS — R2689 Other abnormalities of gait and mobility: Secondary | ICD-10-CM

## 2019-09-12 DIAGNOSIS — R600 Localized edema: Secondary | ICD-10-CM

## 2019-09-12 NOTE — Therapy (Signed)
[place], [name], [address] Phone: [phone]   Fax:  [phone]  Physical Therapy Treatment  Patient Details  Name: Micheal Bentley MRN: [id number] Date of Birth: Feb 14, 2001 Referring Provider (PT): Dr Meredith Pel    Encounter Date: 09/12/2019  PT End of Session - 09/12/19 1404    Visit Number  4    Number of Visits  16    Date for PT Re-Evaluation  09/19/19    Authorization Type  MCD    Authorization Time Period  07/31/2019-09/24/2019    Authorization - Visit Number  13    Authorization - Number of Visits  16    PT Start Time  6962    PT Stop Time  0930    PT Time Calculation (min)  35 min    Activity Tolerance  Patient tolerated treatment well    Behavior During Therapy  [facility] for tasks assessed/performed       Past Medical History:  Diagnosis Date  . Asthma    Not an active issue  . Seasonal allergies     Past Surgical History:  Procedure Laterality Date  . ANTERIOR CRUCIATE LIGAMENT REPAIR Right 05/29/2019   Procedure: RIGHT KNEE RECONSTRUCTION ANTERIOR CRUCIATE LIGAMENT (ACL) WITH HAMSTRING AUTOGRAFT, MENISCAL REPAIR;  Surgeon: Meredith Pel, MD;  Location: [place];  Service: Orthopedics;  Laterality: Right;  . NO PAST SURGERIES    . OSTEOCHONDRAL DEFECT REPAIR/RECONSTRUCTION Right 07/11/2019   Procedure: right knee open osteochondral defect placement with bone  marrow aspiration;  Surgeon: Meredith Pel, MD;  Location: [place];  Service: Orthopedics;  Laterality: Right;    There were no vitals filed for this visit.  Subjective Assessment - 09/12/19 0938    Subjective  Patient continues to have no complaints. He had no pain or swelling last visit.    How long can you stand comfortably?  no limit on crutches    How long can you walk comfortably?  no limit    Diagnostic tests  nothing post op    Patient Stated Goals  run and jog    Currently in Pain?   No/denies                       [facility] Adult PT Treatment/Exercise - 09/12/19 0001      Knee/Hip Exercises: Aerobic   Elliptical  5 min       Knee/Hip Exercises: Machines for Strengthening   Cybex Leg Press  3x10 40 lb to 60 degrees       Knee/Hip Exercises: Standing   Functional Squat  3 sets;10 reps    Functional Squat Limitations  squat with 30 lb kettle bell to 4 inch step     Other Standing Knee Exercises  kettle bell swing 2x20     Other Standing Knee Exercises  rebounder 2x20 yellow ball; d2 kettle bell 2x10; kettle bell swing 15 x20; kettle bell squat 0-60 x20 25 lb      Knee/Hip Exercises: Supine   Bridges Limitations  bridge on ball x20     Straight Leg Raises  1 set;20 reps    Straight Leg Raises Limitations  4#    Knee Flexion Limitations  reverse crunch x20              PT Education - 09/12/19 0939    Education Details  watch swelling    Person(s) Educated  Patient  Methods  Explanation;Tactile cues;Verbal cues;Demonstration    Comprehension  Verbalized understanding;Returned demonstration;Verbal cues required;Tactile cues required       PT Short Term Goals - 09/07/19 1609      PT SHORT TERM GOAL #1   Title  Patient will increase passive knee flexion by 15 degrees    Baseline  116 degrees 08/30/2018    Time  3    Period  Weeks    Status  Achieved    Target Date  08/15/19      PT SHORT TERM GOAL #2   Title  Patient will begin partial weight bearing    Baseline  full weight bearing without difficulty    Time  3    Period  Weeks    Status  Achieved    Target Date  08/15/19      PT SHORT TERM GOAL #3   Title  Patient will begin exercises per MD recocmendation    Baseline  unable to perfrorm ther-ex    Time  3    Period  Weeks    Status  Achieved               Plan - 09/12/19 1405    Clinical Impression Statement  Patient was 10 minutes later for his appointment. Therapy focused on advancing his squat a little deeper.  He did a squat from a 4 inch step. He tolerated treatment well.    Examination-Activity Limitations  Stand;Locomotion Level;Stairs;Squat    Examination-Participation Restrictions  School;Community Activity    Stability/Clinical Decision Making  Stable/Uncomplicated    Clinical Decision Making  Low    Rehab Potential  Excellent    PT Frequency  2x / week    PT Duration  8 weeks    PT Treatment/Interventions  ADLs/Self Care Home Management;Cryotherapy;Electrical Stimulation;Iontophoresis 4mg /ml Dexamethasone;Moist Heat;Manual techniques;Gait training;Therapeutic activities;Therapeutic exercise;Balance training;Neuromuscular re-education;Patient/family education;Dry needling;Passive range of motion;Taping;Joint Manipulations    PT Next Visit Plan  Establish new STG and LTG; progress per OATs protocol; continue to progress closed chain low range erxercises and propriception/ external focus; assess tolerance to a deeper step    PT Home Exercise Plan  Supine heel slide with overpressure at end range, prone quad stretch with strap, supine hamstring stretch with strap, 4-way SLR, SL balance    Consulted and Agree with Plan of Care  Patient       Patient will benefit from skilled therapeutic intervention in order to improve the following deficits and impairments:  Abnormal gait, Difficulty walking, Pain, Decreased mobility, Decreased strength, Decreased activity tolerance, Decreased range of motion  Visit Diagnosis: Stiffness of right knee, not elsewhere classified  Other abnormalities of gait and mobility  Localized edema  Acute pain of right knee     Problem List Patient Active Problem List   Diagnosis Date Noted  . 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 Locking of right knee 05/02/2019    05/04/2019 PT DPT  09/12/2019, 2:09 PM  [facility] [address] [place], [place], [place] Phone: [phone]   Fax:   [phone]  Name: Micheal Bentley MRN: [name] Date of Birth: 05-Feb-2001

## 2019-09-14 ENCOUNTER — Ambulatory Visit: Payer: Medicaid Other | Admitting: Physical Therapy

## 2019-09-14 ENCOUNTER — Other Ambulatory Visit: Payer: Self-pay

## 2019-09-14 DIAGNOSIS — M25661 Stiffness of right knee, not elsewhere classified: Secondary | ICD-10-CM

## 2019-09-14 DIAGNOSIS — R2689 Other abnormalities of gait and mobility: Secondary | ICD-10-CM

## 2019-09-14 DIAGNOSIS — M25561 Pain in right knee: Secondary | ICD-10-CM

## 2019-09-14 DIAGNOSIS — R600 Localized edema: Secondary | ICD-10-CM

## 2019-09-14 NOTE — Therapy (Signed)
[place], [name], [address] Phone: [phone]   Fax:  [phone]  Physical Therapy Treatment  Patient Details  Name: Micheal Bentley MRN: [id number] Date of Birth: 03/27/2001 Referring Provider (PT): Dr Meredith Pel    Encounter Date: 09/14/2019  PT End of Session - 09/14/19 1002    Visit Number  5    Number of Visits  16    Date for PT Re-Evaluation  09/19/19    Authorization Type  MCD    Authorization Time Period  07/31/2019-09/24/2019    PT Start Time  0858   13 minutes late   PT Stop Time  0930    PT Time Calculation (min)  32 min    Activity Tolerance  Patient tolerated treatment well    Behavior During Therapy  [facility] for tasks assessed/performed       Past Medical History:  Diagnosis Date  . Asthma    Not an active issue  . Seasonal allergies     Past Surgical History:  Procedure Laterality Date  . ANTERIOR CRUCIATE LIGAMENT REPAIR Right 05/29/2019   Procedure: RIGHT KNEE RECONSTRUCTION ANTERIOR CRUCIATE LIGAMENT (ACL) WITH HAMSTRING AUTOGRAFT, MENISCAL REPAIR;  Surgeon: Meredith Pel, MD;  Location: [place];  Service: Orthopedics;  Laterality: Right;  . NO PAST SURGERIES    . OSTEOCHONDRAL DEFECT REPAIR/RECONSTRUCTION Right 07/11/2019   Procedure: right knee open osteochondral defect placement with bone  marrow aspiration;  Surgeon: Meredith Pel, MD;  Location: [place];  Service: Orthopedics;  Laterality: Right;    There were no vitals filed for this visit.  Subjective Assessment - 09/14/19 0956    Subjective  Patient had no complaints after the last visit. he was 15 minutes late again. He was advised he needs to be ontime because we are loosing out on time to trian.    How long can you stand comfortably?  no limit on crutches    How long can you walk comfortably?  no limit    Diagnostic tests  nothing post op    Patient Stated Goals  run and jog     Currently in Pain?  No/denies                       [facility] Adult PT Treatment/Exercise - 09/14/19 0001      Knee/Hip Exercises: Aerobic   Elliptical  5 min       Knee/Hip Exercises: Standing   Functional Squat  3 sets;10 reps    Functional Squat Limitations  squat with 30 lb kettle bell to 4 inch step     Other Standing Knee Exercises  eccentric step down 2x10 4 inch; low aplitude step over hurdle x20; side shuffle 15' x6     Other Standing Knee Exercises  rebounder 2x20 yellow ball on air-ex ; d2 kettle bell 2x10 5 and 10lb; kettle bell swing 15 x20; kettle bell squat 0-60 x20 25 lb       Knee/Hip Exercises: Supine   Bridges Limitations  bridge on ball x20     Straight Leg Raises  1 set;20 reps    Straight Leg Raises Limitations  4#    Knee Flexion Limitations  reverse crunch x20              PT Education - 09/14/19 1002    Education Details  advancement to light movement activity    Person(s) Educated  Patient  Methods  Explanation;Demonstration;Tactile cues;Verbal cues    Comprehension  Verbalized understanding;Returned demonstration;Verbal cues required;Tactile cues required       PT Short Term Goals - 09/07/19 1609      PT SHORT TERM GOAL #1   Title  Patient will increase passive knee flexion by 15 degrees    Baseline  116 degrees 08/30/2018    Time  3    Period  Weeks    Status  Achieved    Target Date  08/15/19      PT SHORT TERM GOAL #2   Title  Patient will begin partial weight bearing    Baseline  full weight bearing without difficulty    Time  3    Period  Weeks    Status  Achieved    Target Date  08/15/19      PT SHORT TERM GOAL #3   Title  Patient will begin exercises per MD recocmendation    Baseline  unable to perfrorm ther-ex    Time  3    Period  Weeks    Status  Achieved               Plan - 09/14/19 1611    Clinical Impression Statement  Patient advised he lneeds to conme on time. Therapy added low amplitude  hurdle step and; side stepping with pace. He had no increase in pain. Therapy focused on single leg strengthening today. His stability has improved.    Examination-Activity Limitations  Stand;Locomotion Level;Stairs;Squat    Examination-Participation Restrictions  School;Community Activity    Stability/Clinical Decision Making  Stable/Uncomplicated    Clinical Decision Making  Low    Rehab Potential  Excellent    PT Frequency  2x / week    PT Duration  8 weeks    PT Treatment/Interventions  ADLs/Self Care Home Management;Cryotherapy;Electrical Stimulation;Iontophoresis 4mg /ml Dexamethasone;Moist Heat;Manual techniques;Gait training;Therapeutic activities;Therapeutic exercise;Balance training;Neuromuscular re-education;Patient/family education;Dry needling;Passive range of motion;Taping;Joint Manipulations    PT Next Visit Plan  Establish new STG and LTG; progress per OATs protocol; continue to progress closed chain low range erxercises and propriception/ external focus; assess tolerance to a deeper step    PT Home Exercise Plan  Supine heel slide with overpressure at end range, prone quad stretch with strap, supine hamstring stretch with strap, 4-way SLR, SL balance    Consulted and Agree with Plan of Care  Patient       Patient will benefit from skilled therapeutic intervention in order to improve the following deficits and impairments:  Abnormal gait, Difficulty walking, Pain, Decreased mobility, Decreased strength, Decreased activity tolerance, Decreased range of motion  Visit Diagnosis: Stiffness of right knee, not elsewhere classified  Other abnormalities of gait and mobility  Localized edema  Acute pain of right knee     Problem List Patient Active Problem List   Diagnosis Date Noted  . 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 Locking of right knee 05/02/2019    05/04/2019 PT DPT  09/14/2019, 4:16 PM  [facility] [address] [place], [place], [place] Phone: [phone]   Fax:  [phone]  Name: Micheal Bentley MRN: [name] Date of Birth: August 01, 2001

## 2019-09-19 ENCOUNTER — Other Ambulatory Visit: Payer: Self-pay

## 2019-09-19 ENCOUNTER — Encounter: Payer: Self-pay | Admitting: Physical Therapy

## 2019-09-19 ENCOUNTER — Ambulatory Visit: Payer: Medicaid Other | Attending: Orthopedic Surgery | Admitting: Physical Therapy

## 2019-09-19 DIAGNOSIS — R2689 Other abnormalities of gait and mobility: Secondary | ICD-10-CM | POA: Diagnosis present

## 2019-09-19 DIAGNOSIS — M25561 Pain in right knee: Secondary | ICD-10-CM | POA: Diagnosis present

## 2019-09-19 DIAGNOSIS — M25661 Stiffness of right knee, not elsewhere classified: Secondary | ICD-10-CM | POA: Insufficient documentation

## 2019-09-19 DIAGNOSIS — R600 Localized edema: Secondary | ICD-10-CM | POA: Diagnosis present

## 2019-09-19 NOTE — Therapy (Signed)
[place], [name], [address] Phone: [phone]   Fax:  [phone]  Physical Therapy Treatment  Patient Details  Name: Micheal Bentley MRN: [id number] Date of Birth: 2001/06/12 Referring Provider (PT): Dr Meredith Pel    Encounter Date: 09/19/2019  PT End of Session - 09/19/19 0822    Visit Number  16    Number of Visits  16    Date for PT Re-Evaluation  09/19/19    Authorization Type  MCD    Authorization Time Period  07/31/2019-09/24/2019    Authorization - Visit Number  16    Authorization - Number of Visits  16    PT Start Time  0806   Patient 6 minutes late   PT Stop Time  0845    PT Time Calculation (min)  39 min    Activity Tolerance  Patient tolerated treatment well    Behavior During Therapy  [facility] for tasks assessed/performed       Past Medical History:  Diagnosis Date  . Asthma    Not an active issue  . Seasonal allergies     Past Surgical History:  Procedure Laterality Date  . ANTERIOR CRUCIATE LIGAMENT REPAIR Right 05/29/2019   Procedure: RIGHT KNEE RECONSTRUCTION ANTERIOR CRUCIATE LIGAMENT (ACL) WITH HAMSTRING AUTOGRAFT, MENISCAL REPAIR;  Surgeon: Meredith Pel, MD;  Location: [place];  Service: Orthopedics;  Laterality: Right;  . NO PAST SURGERIES    . OSTEOCHONDRAL DEFECT REPAIR/RECONSTRUCTION Right 07/11/2019   Procedure: right knee open osteochondral defect placement with bone  marrow aspiration;  Surgeon: Meredith Pel, MD;  Location: [place];  Service: Orthopedics;  Laterality: Right;    There were no vitals filed for this visit.  Subjective Assessment - 09/19/19 0819    Subjective  Patient has no complaints at this time. He had no pain or swelling after the last visit.    How long can you stand comfortably?  no limit on crutches    How long can you walk comfortably?  no limit    Diagnostic tests  nothing post op    Patient Stated Goals   run and jog    Currently in Pain?  No/denies                       [facility] Adult PT Treatment/Exercise - 09/19/19 0001      Knee/Hip Exercises: Aerobic   Elliptical  5 min       Knee/Hip Exercises: Machines for Strengthening   Cybex Leg Press  x10 60 2x10 80       Knee/Hip Exercises: Standing   Functional Squat  3 sets;10 reps    Functional Squat Limitations  squat with 45 lb kettle bell to the floor     Other Standing Knee Exercises  eccentric step down 2x10 4 inch; low aplitude step over hurdle x20; side shuffle 15' x6     Other Standing Knee Exercises  rebounder 2x20 yellow ball on air-ex ; d2 kettle bell 2x10 5 and 10lb; kettle bell swing 15 x20; kettle bell squat 0-60 x20 25 lb       Knee/Hip Exercises: Supine   Bridges Limitations  bridge on ball x20     Straight Leg Raises  1 set;20 reps    Straight Leg Raises Limitations  4#    Knee Flexion Limitations  reverse crunch x20  PT Education - 09/19/19 5188    Education Details  reviewed HEP    Person(s) Educated  Patient    Methods  Explanation;Demonstration;Tactile cues;Verbal cues    Comprehension  Verbalized understanding;Returned demonstration;Verbal cues required;Tactile cues required       PT Short Term Goals - 09/19/19 1337      PT SHORT TERM GOAL #1   Title  Patient will increase passive knee flexion by 15 degrees    Baseline  116 degrees 08/30/2018    Time  3    Period  Weeks    Status  Achieved    Target Date  08/15/19      PT SHORT TERM GOAL #2   Title  Patient will begin partial weight bearing    Baseline  full weight bearing without difficulty    Time  3    Period  Weeks    Status  Achieved    Target Date  08/15/19      PT SHORT TERM GOAL #3   Title  Patient will begin exercises per MD recocmendation    Baseline  unable to perfrorm ther-ex    Time  3    Period  Weeks    Status  Achieved               Plan - 09/19/19 1332    Clinical Impression  Statement  Patient will be re-assesd next visit. Therapy continues to advance weights and depth of his squat. He is making great progress. Therapy will likely put him on 1x a week until he sees the MD. At that time we will continue if he can return to straight line running. Ifit will still be some time he will likley be put on hold and continue exercises at home.    Examination-Activity Limitations  Stand;Locomotion Level;Stairs;Squat    Stability/Clinical Decision Making  Stable/Uncomplicated    Clinical Decision Making  Low    Rehab Potential  Excellent    PT Frequency  2x / week    PT Duration  8 weeks    PT Treatment/Interventions  ADLs/Self Care Home Management;Cryotherapy;Electrical Stimulation;Iontophoresis 4mg /ml Dexamethasone;Moist Heat;Manual techniques;Gait training;Therapeutic activities;Therapeutic exercise;Balance training;Neuromuscular re-education;Patient/family education;Dry needling;Passive range of motion;Taping;Joint Manipulations    PT Next Visit Plan  Establish new STG and LTG; progress per OATs protocol; continue to progress closed chain low range erxercises and propriception/ external focus; assess tolerance to a deeper step    PT Home Exercise Plan  Supine heel slide with overpressure at end range, prone quad stretch with strap, supine hamstring stretch with strap, 4-way SLR, SL balance    Consulted and Agree with Plan of Care  Patient       Patient will benefit from skilled therapeutic intervention in order to improve the following deficits and impairments:  Abnormal gait, Difficulty walking, Pain, Decreased mobility, Decreased strength, Decreased activity tolerance, Decreased range of motion  Visit Diagnosis: Stiffness of right knee, not elsewhere classified  Other abnormalities of gait and mobility  Localized edema  Acute pain of right knee     Problem List Patient Active Problem List   Diagnosis Date Noted  . Osteochondral defect of femoral condyle  07/11/2019  . Locking of right knee 05/02/2019    05/04/2019  PT DPT  09/19/2019, 1:42 PM  [facility] [address] [place], [place], [place] Phone: [phone]   Fax:  [phone]  Name: Micheal Bentley MRN: [name] Date of Birth: [birth year]

## 2019-09-21 ENCOUNTER — Ambulatory Visit: Payer: Medicaid Other | Admitting: Physical Therapy

## 2019-09-21 ENCOUNTER — Encounter: Payer: Self-pay | Admitting: Physical Therapy

## 2019-09-21 ENCOUNTER — Other Ambulatory Visit: Payer: Self-pay

## 2019-09-21 DIAGNOSIS — M25661 Stiffness of right knee, not elsewhere classified: Secondary | ICD-10-CM | POA: Diagnosis not present

## 2019-09-21 DIAGNOSIS — R600 Localized edema: Secondary | ICD-10-CM

## 2019-09-21 DIAGNOSIS — M25561 Pain in right knee: Secondary | ICD-10-CM

## 2019-09-21 DIAGNOSIS — R2689 Other abnormalities of gait and mobility: Secondary | ICD-10-CM

## 2019-09-21 NOTE — Therapy (Signed)
[facility] [address] [place], [place], [address] Phone: [phone]   Fax:  [phone]  Physical Therapy Treatment  Patient Details  Name: Micheal Bentley MRN: [id number] Date of Birth: 11-02-2000 Referring Provider (PT): Dr Cammy Copa    Encounter Date: 09/21/2019  PT End of Session - 09/21/19 1305    Visit Number  16    Number of Visits  22    Date for PT Re-Evaluation  11/02/19    Authorization Type  MCD    Authorization Time Period  07/31/2019-09/24/2019    PT Start Time  0853    PT Stop Time  0931    PT Time Calculation (min)  38 min       Past Medical History:  Diagnosis Date  . Asthma    Not an active issue  . Seasonal allergies     Past Surgical History:  Procedure Laterality Date  . ANTERIOR CRUCIATE LIGAMENT REPAIR Right 05/29/2019   Procedure: RIGHT KNEE RECONSTRUCTION ANTERIOR CRUCIATE LIGAMENT (ACL) WITH HAMSTRING AUTOGRAFT, MENISCAL REPAIR;  Surgeon: Cammy Copa, MD;  Location: [facility];  Service: Orthopedics;  Laterality: Right;  . NO PAST SURGERIES    . OSTEOCHONDRAL DEFECT REPAIR/RECONSTRUCTION Right 07/11/2019   Procedure: right knee open osteochondral defect placement with bone  marrow aspiration;  Surgeon: Cammy Copa, MD;  Location: [facility];  Service: Orthopedics;  Laterality: Right;    There were no vitals filed for this visit.  Subjective Assessment - 09/21/19 0856    Subjective  Patient continues to have no pain. He has been working on his exercises at home.    How long can you stand comfortably?  no limit on crutches    How long can you walk comfortably?  no limit    Diagnostic tests  nothing post op    Currently in Pain?  No/denies    Pain Location  Knee    Pain Orientation  Right    Pain Descriptors / Indicators  Aching    Pain Type  Chronic pain         [facility] PT Assessment - 09/21/19 0001      Functional Tests   Functional tests  Step  up;Squat;Single leg stance      Squat   Comments  can perfrom with goo technique       Step Up   Comments  good stability with step up       Single Leg Stance   Comments  still fatigues with dynamic stability exercises       PROM   Right Knee Flexion  124      Strength   Overall Strength Comments  5/5 gross                    [facility] Adult PT Treatment/Exercise - 09/21/19 0001      Knee/Hip Exercises: Aerobic   Elliptical  5 min       Knee/Hip Exercises: Machines for Strengthening   Cybex Leg Press  x10 60 2x10 80       Knee/Hip Exercises: Standing   Functional Squat  3 sets;10 reps    Functional Squat Limitations  squat with 45 lb kettle bell to the floor     Other Standing Knee Exercises  eccentric step down 2x10 4 inch; low aplitude step over hurdle x20; side shuffle 15' x6 ; low skip 25x4; slow kareoke step 25''x4     Other Standing  Knee Exercises  rebounder 2x20 yellow ball on air-ex ; d2 kettle bell 2x10 5 and 10lb; kettle bell swing 15 x20; kettle bell squat 0-60 x20 25 lb       Knee/Hip Exercises: Supine   Bridges Limitations  bridge on ball x20     Straight Leg Raises  1 set;20 reps    Straight Leg Raises Limitations  4#    Knee Flexion Limitations  reverse crunch x20                PT Short Term Goals - 09/21/19 1312      PT SHORT TERM GOAL #1   Title  Patient will increase passive knee flexion by 15 degrees    Baseline  116 degrees 08/30/2018    Time  3    Period  Weeks    Status  Achieved    Target Date  08/15/19      PT SHORT TERM GOAL #2   Title  Patient will begin partial weight bearing    Baseline  full weight bearing without difficulty    Time  3    Period  Weeks    Status  Achieved    Target Date  08/15/19      PT SHORT TERM GOAL #3   Title  Patient will begin exercises per MD recocmendation    Baseline  unable to perfrorm ther-ex    Time  3    Period  Weeks    Status  Achieved      PT SHORT TERM GOAL #4   Title   therapy will estabilish functional long and short term gaosl when able to assess        PT Long Term Goals - 09/21/19 1313      PT LONG TERM GOAL #1   Title  Patient will begin straight line running    Baseline  not cleared at this time    Time  6    Period  Weeks    Status  New    Target Date  11/02/19      PT LONG TERM GOAL #2   Title  Patient will demonstrate good dynamic stability with single leg stance activity    Baseline  fair control with dynamoc single leg stability    Time  6    Period  Weeks    Status  New    Target Date  11/02/19      PT LONG TERM GOAL #3   Title  Patient will be independent with advanced strengthening and stability program to advance him to return to sport activity    Baseline  is progressing but is missing a straight line running component.    Time  6    Period  Weeks    Status  New    Target Date  11/02/19            Plan - 09/21/19 1306    Clinical Impression Statement  Patient is currently 10 weeks post-op Osteochondral allograft surgery and 16 weeks post-op right ACL repair with hamstring graft. He is makig great progress. Patient hasfocused on increasing single leg stability and progressive loading of the quad. he continues to hvae mild single legstability defcits. He is yet to return to straight line running. Therapy will see patient 1x a eweke until he sees the MD. If he is cleared for straight line running we will get him on a running program and discharge until it is time for  return to sport training. If he is not cleared to run he will continue with his exercises on his own until he is. He is having no pain with high level strengtheng. He has begun low speed movement drills without pain or swelling.    Examination-Activity Limitations  Stand;Locomotion Level;Stairs;Squat    Examination-Participation Restrictions  School;Community Activity    Clinical Decision Making  Low    Rehab Potential  Excellent    PT Frequency  1x / week     PT Duration  6 weeks    PT Treatment/Interventions  ADLs/Self Care Home Management;Cryotherapy;Electrical Stimulation;Iontophoresis 4mg /ml Dexamethasone;Moist Heat;Manual techniques;Gait training;Therapeutic activities;Therapeutic exercise;Balance training;Neuromuscular re-education;Patient/family education;Dry needling;Passive range of motion;Taping;Joint Manipulations    PT Next Visit Plan  Establish new STG and LTG; progress per OATs protocol; continue to progress closed chain low range erxercises and propriception/ external focus; assess tolerance to a deeper step    PT Home Exercise Plan  Supine heel slide with overpressure at end range, prone quad stretch with strap, supine hamstring stretch with strap, 4-way SLR, SL balance    Consulted and Agree with Plan of Care  Patient       Patient will benefit from skilled therapeutic intervention in order to improve the following deficits and impairments:  Abnormal gait, Difficulty walking, Pain, Decreased mobility, Decreased strength, Decreased activity tolerance, Decreased range of motion  Visit Diagnosis: Stiffness of right knee, not elsewhere classified  Other abnormalities of gait and mobility  Localized edema  Acute pain of right knee     Problem List Patient Active Problem List   Diagnosis Date Noted  . 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 Locking of right knee 05/02/2019    Carney Living 09/21/2019, 1:20 PM  [facility] [address] [place], [name], [address] Phone: [phone]   Fax:  [phone]  Name: BRANDAN ROBICHEAUX MRN: [id number] Date of Birth: Sep 22, 2000

## 2019-09-26 ENCOUNTER — Ambulatory Visit: Payer: Medicaid Other | Admitting: Physical Therapy

## 2019-09-26 ENCOUNTER — Other Ambulatory Visit: Payer: Self-pay

## 2019-09-26 DIAGNOSIS — M25661 Stiffness of right knee, not elsewhere classified: Secondary | ICD-10-CM | POA: Diagnosis not present

## 2019-09-26 DIAGNOSIS — R2689 Other abnormalities of gait and mobility: Secondary | ICD-10-CM

## 2019-09-26 DIAGNOSIS — M25561 Pain in right knee: Secondary | ICD-10-CM

## 2019-09-26 DIAGNOSIS — R600 Localized edema: Secondary | ICD-10-CM

## 2019-09-26 NOTE — Therapy (Signed)
[facility] [address] [place], [place], [address] Phone: [phone]   Fax:  [phone]  Physical Therapy Treatment  Patient Details  Name: Micheal Bentley MRN: [id number] Date of Birth: [birth year] Referring Provider (PT): Dr Cammy Copa    Encounter Date: 09/26/2019  PT End of Session - 09/26/19 0919    Visit Number  17    Number of Visits  22    Date for PT Re-Evaluation  11/02/19    Authorization Time Period  approved 6 from 09/26/2019 to 11/06/2019    Authorization - Visit Number  1    Authorization - Number of Visits  6    PT Start Time  0850    PT Stop Time  0930    PT Time Calculation (min)  40 min    Activity Tolerance  Patient tolerated treatment well    Behavior During Therapy  [facility] for tasks assessed/performed       Past Medical History:  Diagnosis Date  . Asthma    Not an active issue  . Seasonal allergies     Past Surgical History:  Procedure Laterality Date  . ANTERIOR CRUCIATE LIGAMENT REPAIR Right 05/29/2019   Procedure: RIGHT KNEE RECONSTRUCTION ANTERIOR CRUCIATE LIGAMENT (ACL) WITH HAMSTRING AUTOGRAFT, MENISCAL REPAIR;  Surgeon: Cammy Copa, MD;  Location: [facility];  Service: Orthopedics;  Laterality: Right;  . NO PAST SURGERIES    . OSTEOCHONDRAL DEFECT REPAIR/RECONSTRUCTION Right 07/11/2019   Procedure: right knee open osteochondral defect placement with bone  marrow aspiration;  Surgeon: Cammy Copa, MD;  Location: [facility];  Service: Orthopedics;  Laterality: Right;    There were no vitals filed for this visit.  Subjective Assessment - 09/26/19 0918    How long can you stand comfortably?  no limit on crutches    How long can you walk comfortably?  no limit    Diagnostic tests  nothing post op    Currently in Pain?  No/denies       Subjective: no pain after last treatment. No pain today.                 [facility] Adult PT Treatment/Exercise -  09/26/19 0001      Knee/Hip Exercises: Aerobic   Elliptical  5 min       Knee/Hip Exercises: Machines for Strengthening   Cybex Leg Press  x10 60 2x10 80       Knee/Hip Exercises: Standing   Functional Squat  3 sets;10 reps    Functional Squat Limitations  squat with 45 lb kettle bell to the floor     Other Standing Knee Exercises  eccentric step down 2x10 6 inch; low aplitude step over hurdle x20; side shuffle 15' x6 ; low skip 25x4; slow kareoke step 25''x4;; bosu squat x20     Other Standing Knee Exercises  rebounder 2x20 yellow ball on air-ex ; d2 kettle bell 2x10 5 and 10lb; kettle bell swing 15 x20;       Knee/Hip Exercises: Supine   Bridges Limitations  bridge on ball x20     Straight Leg Raises  1 set;20 reps    Straight Leg Raises Limitations  4#    Knee Flexion Limitations  reverse crunch x20              PT Education - 09/26/19 0918    Education Details  HEP and symptom mangement    Person(s) Educated  Patient  Methods  Demonstration;Explanation;Verbal cues;Tactile cues    Comprehension  Verbalized understanding;Returned demonstration;Verbal cues required;Tactile cues required       PT Short Term Goals - 09/21/19 1312      PT SHORT TERM GOAL #1   Title  Patient will increase passive knee flexion by 15 degrees    Baseline  116 degrees 08/30/2018    Time  3    Period  Weeks    Status  Achieved    Target Date  08/15/19      PT SHORT TERM GOAL #2   Title  Patient will begin partial weight bearing    Baseline  full weight bearing without difficulty    Time  3    Period  Weeks    Status  Achieved    Target Date  08/15/19      PT SHORT TERM GOAL #3   Title  Patient will begin exercises per MD recocmendation    Baseline  unable to perfrorm ther-ex    Time  3    Period  Weeks    Status  Achieved      PT SHORT TERM GOAL #4   Title  therapy will estabilish functional long and short term gaosl when able to assess        PT Long Term Goals - 09/21/19  1313      PT LONG TERM GOAL #1   Title  Patient will begin straight line running    Baseline  not cleared at this time    Time  6    Period  Weeks    Status  New    Target Date  11/02/19      PT LONG TERM GOAL #2   Title  Patient will demonstrate good dynamic stability with single leg stance activity    Baseline  fair control with dynamoc single leg stability    Time  6    Period  Weeks    Status  New    Target Date  11/02/19      PT LONG TERM GOAL #3   Title  Patient will be independent with advanced strengthening and stability program to advance him to return to sport activity    Baseline  is progressing but is missing a straight line running component.    Time  6    Period  Weeks    Status  New    Target Date  11/02/19            Plan - 09/26/19 8756    Clinical Impression Statement  Patient is lacking considence with low skips but he is having no pain. He was able to tolerate light meovement drills well. Therapy will continue to progress as tolerated. Therapy added bosu squats today without difficulty.    Examination-Activity Limitations  Stand;Locomotion Level;Stairs;Squat    Examination-Participation Restrictions  School;Community Activity    Stability/Clinical Decision Making  Stable/Uncomplicated    Clinical Decision Making  Low    Rehab Potential  Excellent    PT Frequency  1x / week    PT Duration  6 weeks    PT Treatment/Interventions  ADLs/Self Care Home Management;Cryotherapy;Electrical Stimulation;Iontophoresis 4mg /ml Dexamethasone;Moist Heat;Manual techniques;Gait training;Therapeutic activities;Therapeutic exercise;Balance training;Neuromuscular re-education;Patient/family education;Dry needling;Passive range of motion;Taping;Joint Manipulations    PT Next Visit Plan  Establish new STG and LTG; progress per OATs protocol; continue to progress closed chain low range erxercises and propriception/ external focus; assess tolerance to a deeper step    PT Home  Exercise Plan  Supine heel slide with overpressure at end range, prone quad stretch with strap, supine hamstring stretch with strap, 4-way SLR, SL balance       Patient will benefit from skilled therapeutic intervention in order to improve the following deficits and impairments:  Abnormal gait, Difficulty walking, Pain, Decreased mobility, Decreased strength, Decreased activity tolerance, Decreased range of motion  Visit Diagnosis: Stiffness of right knee, not elsewhere classified  Other abnormalities of gait and mobility  Localized edema  Acute pain of right knee     Problem List Patient Active Problem List   Diagnosis Date Noted  . 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 Locking of right knee 05/02/2019    Dessie Coma 09/26/2019, 9:52 AM  [facility] [address] [place], [place], [address] Phone: [phone]   Fax:  [phone]  Name: Micheal Bentley MRN: [id number] Date of Birth: 06-Oct-2000

## 2019-10-06 ENCOUNTER — Encounter: Payer: Self-pay | Admitting: Physical Therapy

## 2019-10-06 ENCOUNTER — Ambulatory Visit: Payer: Medicaid Other | Admitting: Physical Therapy

## 2019-10-06 ENCOUNTER — Other Ambulatory Visit: Payer: Self-pay

## 2019-10-06 DIAGNOSIS — M25661 Stiffness of right knee, not elsewhere classified: Secondary | ICD-10-CM

## 2019-10-06 DIAGNOSIS — M25561 Pain in right knee: Secondary | ICD-10-CM

## 2019-10-06 DIAGNOSIS — R600 Localized edema: Secondary | ICD-10-CM

## 2019-10-06 DIAGNOSIS — R2689 Other abnormalities of gait and mobility: Secondary | ICD-10-CM

## 2019-10-06 NOTE — Therapy (Signed)
[place], [name], [address] Phone: [phone]   Fax:  [phone]  Physical Therapy Treatment  Patient Details  Name: Micheal Bentley MRN: [id number] Date of Birth: Aug 28, 2000 Referring Provider (PT): Dr Meredith Pel    Encounter Date: 10/06/2019  PT End of Session - 10/06/19 0816    Visit Number  18    Number of Visits  22    Date for PT Re-Evaluation  11/02/19    Authorization Type  MCD    Authorization Time Period  approved 6 from 09/26/2019 to 11/06/2019    Authorization - Visit Number  1    Authorization - Number of Visits  6    PT Start Time  0808   Patient was 8 min late to his appointment   PT Stop Time  0846    PT Time Calculation (min)  38 min    Activity Tolerance  Patient tolerated treatment well    Behavior During Therapy  [facility] for tasks assessed/performed       Past Medical History:  Diagnosis Date  . Asthma    Not an active issue  . Seasonal allergies     Past Surgical History:  Procedure Laterality Date  . ANTERIOR CRUCIATE LIGAMENT REPAIR Right 05/29/2019   Procedure: RIGHT KNEE RECONSTRUCTION ANTERIOR CRUCIATE LIGAMENT (ACL) WITH HAMSTRING AUTOGRAFT, MENISCAL REPAIR;  Surgeon: Meredith Pel, MD;  Location: [place];  Service: Orthopedics;  Laterality: Right;  . NO PAST SURGERIES    . OSTEOCHONDRAL DEFECT REPAIR/RECONSTRUCTION Right 07/11/2019   Procedure: right knee open osteochondral defect placement with bone  marrow aspiration;  Surgeon: Meredith Pel, MD;  Location: [place];  Service: Orthopedics;  Laterality: Right;    There were no vitals filed for this visit.  Subjective Assessment - 10/06/19 0814    Subjective  Patient reports he twited his knee when he was sitting now he is feeling a mild pinching in the medial aspect gf his knee. hehas otherwise been doing well.    How long can you stand comfortably?  no limit on crutches    How  long can you walk comfortably?  no limit    Diagnostic tests  nothing post op    Patient Stated Goals  run and jog    Currently in Pain?  No/denies                       [facility] Adult PT Treatment/Exercise - 10/06/19 0001      Knee/Hip Exercises: Aerobic   Elliptical  5 min       Knee/Hip Exercises: Machines for Strengthening   Cybex Leg Press  x10 60 2x10 80       Knee/Hip Exercises: Standing   Other Standing Knee Exercises  eccentric step down 2x10 6 inch; , cone drill to stool x20 each leg; bosu squat x10; rebounder on air-ex x20 bilateral     Other Standing Knee Exercises  cable walk 13.5 5 fwd 5 back; lateral band walk black x10 each wayt 2 laps; moster walk x10;  Pallof  press 10.5 x20 each way;              PT Education - 10/06/19 0815    Education Details  reviewed HEp and symptom mangement    Person(s) Educated  Patient    Methods  Explanation;Demonstration;Tactile cues;Verbal cues    Comprehension  Verbalized understanding;Returned demonstration;Verbal cues required;Tactile cues required  PT Short Term Goals - 09/21/19 1312      PT SHORT TERM GOAL #1   Title  Patient will increase passive knee flexion by 15 degrees    Baseline  116 degrees 08/30/2018    Time  3    Period  Weeks    Status  Achieved    Target Date  08/15/19      PT SHORT TERM GOAL #2   Title  Patient will begin partial weight bearing    Baseline  full weight bearing without difficulty    Time  3    Period  Weeks    Status  Achieved    Target Date  08/15/19      PT SHORT TERM GOAL #3   Title  Patient will begin exercises per MD recocmendation    Baseline  unable to perfrorm ther-ex    Time  3    Period  Weeks    Status  Achieved      PT SHORT TERM GOAL #4   Title  therapy will estabilish functional long and short term gaosl when able to assess        PT Long Term Goals - 09/21/19 1313      PT LONG TERM GOAL #1   Title  Patient will begin straight line  running    Baseline  not cleared at this time    Time  6    Period  Weeks    Status  New    Target Date  11/02/19      PT LONG TERM GOAL #2   Title  Patient will demonstrate good dynamic stability with single leg stance activity    Baseline  fair control with dynamoc single leg stability    Time  6    Period  Weeks    Status  New    Target Date  11/02/19      PT LONG TERM GOAL #3   Title  Patient will be independent with advanced strengthening and stability program to advance him to return to sport activity    Baseline  is progressing but is missing a straight line running component.    Time  6    Period  Weeks    Status  New    Target Date  11/02/19            Plan - 10/06/19 0820    Clinical Impression Statement  Patient perfromed closed chain exercises wihout pain today. Therapy will get him back into light movement drills if he has no pain next visit. Therapy will continue to advance him towards straight line running. he doemonstrated good stability.    Examination-Activity Limitations  Stand;Locomotion Level;Stairs;Squat    Examination-Participation Restrictions  School;Community Activity    Stability/Clinical Decision Making  Stable/Uncomplicated    Clinical Decision Making  Low    Rehab Potential  Excellent    PT Frequency  1x / week    PT Duration  6 weeks    PT Treatment/Interventions  ADLs/Self Care Home Management;Cryotherapy;Electrical Stimulation;Iontophoresis 4mg /ml Dexamethasone;Moist Heat;Manual techniques;Gait training;Therapeutic activities;Therapeutic exercise;Balance training;Neuromuscular re-education;Patient/family education;Dry needling;Passive range of motion;Taping;Joint Manipulations    PT Next Visit Plan  Establish new STG and LTG; progress per OATs protocol; continue to progress closed chain low range erxercises and propriception/ external focus; assess tolerance to a deeper step    PT Home Exercise Plan  Supine heel slide with overpressure at end  range, prone quad stretch with strap, supine hamstring stretch with  strap, 4-way SLR, SL balance    Consulted and Agree with Plan of Care  Patient       Patient will benefit from skilled therapeutic intervention in order to improve the following deficits and impairments:  Abnormal gait, Difficulty walking, Pain, Decreased mobility, Decreased strength, Decreased activity tolerance, Decreased range of motion  Visit Diagnosis: Stiffness of right knee, not elsewhere classified  Other abnormalities of gait and mobility  Localized edema  Acute pain of right knee     Problem List Patient Active Problem List   Diagnosis Date Noted  . 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 Locking of right knee 05/02/2019    Dessie Coma pt dpt  10/06/2019, 9:16 AM  [facility] [address] [place], [place], [address] Phone: [phone]   Fax:  [phone]  Name: Micheal Bentley MRN: [id number] Date of Birth: 2000/08/23

## 2019-10-11 ENCOUNTER — Ambulatory Visit: Payer: Medicaid Other | Admitting: Physical Therapy

## 2019-10-18 ENCOUNTER — Ambulatory Visit: Payer: Medicaid Other | Attending: Orthopedic Surgery | Admitting: Physical Therapy

## 2019-10-18 ENCOUNTER — Other Ambulatory Visit: Payer: Self-pay

## 2019-10-18 ENCOUNTER — Encounter: Payer: Self-pay | Admitting: Physical Therapy

## 2019-10-18 DIAGNOSIS — R600 Localized edema: Secondary | ICD-10-CM | POA: Insufficient documentation

## 2019-10-18 DIAGNOSIS — R2689 Other abnormalities of gait and mobility: Secondary | ICD-10-CM | POA: Insufficient documentation

## 2019-10-18 DIAGNOSIS — M25661 Stiffness of right knee, not elsewhere classified: Secondary | ICD-10-CM | POA: Diagnosis present

## 2019-10-18 DIAGNOSIS — M25561 Pain in right knee: Secondary | ICD-10-CM | POA: Diagnosis present

## 2019-10-18 NOTE — Therapy (Signed)
[place], [name], [address] Phone: [phone]   Fax:  [phone]  Physical Therapy Treatment  Patient Details  Name: Micheal Bentley MRN: [id number] Date of Birth: 2001-03-15 Referring Provider (PT): Dr Meredith Pel    Encounter Date: 10/18/2019  PT End of Session - 10/18/19 0954    Visit Number  19    Number of Visits  22    Date for PT Re-Evaluation  11/02/19    Authorization Type  MCD    Authorization Time Period  approved 6 from 09/26/2019 to 11/06/2019    PT Start Time  3762   Patient 7 minutes late   PT Stop Time  1015    PT Time Calculation (min)  38 min    Activity Tolerance  Patient tolerated treatment well    Behavior During Therapy  [facility] for tasks assessed/performed       Past Medical History:  Diagnosis Date  . Asthma    Not an active issue  . Seasonal allergies     Past Surgical History:  Procedure Laterality Date  . ANTERIOR CRUCIATE LIGAMENT REPAIR Right 05/29/2019   Procedure: RIGHT KNEE RECONSTRUCTION ANTERIOR CRUCIATE LIGAMENT (ACL) WITH HAMSTRING AUTOGRAFT, MENISCAL REPAIR;  Surgeon: Meredith Pel, MD;  Location: [place];  Service: Orthopedics;  Laterality: Right;  . NO PAST SURGERIES    . OSTEOCHONDRAL DEFECT REPAIR/RECONSTRUCTION Right 07/11/2019   Procedure: right knee open osteochondral defect placement with bone  marrow aspiration;  Surgeon: Meredith Pel, MD;  Location: [place];  Service: Orthopedics;  Laterality: Right;    There were no vitals filed for this visit.  Subjective Assessment - 10/18/19 0942    Subjective  Patient is 15 weeks post-op. he is having not pain. He has been working on his exercises. He had mild discomdfort if he loads his knee and twits it.    How long can you stand comfortably?  no limit on crutches    How long can you walk comfortably?  no limit    Diagnostic tests  nothing post op    Patient Stated Goals   run and jog    Currently in Pain?  No/denies                       [facility] Adult PT Treatment/Exercise - 10/18/19 0001      Ambulation/Gait   Gait Comments  Patient began straight line running today. He did not feel comfortable on the tremill so he starteddoing 100'' lengths on the parking lot. he had no increase in pain bt he was hesitant with the right leg. He will see the MD next week. If he is cleared he will begin straight line running on a track. He ran 100'5x at a slow pace      Knee/Hip Exercises: Machines for Strengthening   Cybex Leg Press  x10 60 2x10 80       Knee/Hip Exercises: Standing   Other Standing Knee Exercises  d2 kettle bell 10lb     Other Standing Knee Exercises  cable walk 15 5 fwd 5 back;t 2 laps;;  45lb squat to the floor x20; rebounder 2x20; ;eccentroic step down x20;              PT Education - 10/18/19 0943    Education Details  reviewed HEP and symptom mangement    Person(s) Educated  Patient    Methods  Explanation;Tactile cues;Demonstration;Verbal  cues       PT Short Term Goals - 09/21/19 1312      PT SHORT TERM GOAL #1   Title  Patient will increase passive knee flexion by 15 degrees    Baseline  116 degrees 08/30/2018    Time  3    Period  Weeks    Status  Achieved    Target Date  08/15/19      PT SHORT TERM GOAL #2   Title  Patient will begin partial weight bearing    Baseline  full weight bearing without difficulty    Time  3    Period  Weeks    Status  Achieved    Target Date  08/15/19      PT SHORT TERM GOAL #3   Title  Patient will begin exercises per MD recocmendation    Baseline  unable to perfrorm ther-ex    Time  3    Period  Weeks    Status  Achieved      PT SHORT TERM GOAL #4   Title  therapy will estabilish functional long and short term gaosl when able to assess        PT Long Term Goals - 10/18/19 1006      PT LONG TERM GOAL #1   Title  Patient will begin straight line running    Baseline   not cleared at this time    Time  6    Period  Weeks    Status  Achieved      PT LONG TERM GOAL #2   Title  Patient will demonstrate good dynamic stability with single leg stance activity    Baseline  fair control with dynamic single leg stability    Time  6    Period  Weeks    Status  New      PT LONG TERM GOAL #3   Title  Patient will be independent with advanced strengthening and stability program to advance him to return to sport activity    Baseline  is progressing but is missing a straight line running component.    Time  6    Period  Weeks    Status  New            Plan - 10/18/19 0955    Clinical Impression Statement  Patient began straight line running today. he is 14 weeks post-op from his OATS procedure. He has dmeonstrated good single leg stability and gross strength. He tolerated all other ther-ex well. he was encouraged to continue with his strengthening at home. We will assess his symptoms next visit.    Examination-Activity Limitations  Stand;Locomotion Level;Stairs;Squat    Examination-Participation Restrictions  School;Community Activity    Stability/Clinical Decision Making  Stable/Uncomplicated    Clinical Decision Making  Low    Rehab Potential  Excellent    PT Frequency  1x / week    PT Duration  6 weeks    PT Treatment/Interventions  ADLs/Self Care Home Management;Cryotherapy;Electrical Stimulation;Iontophoresis 4mg /ml Dexamethasone;Moist Heat;Manual techniques;Gait training;Therapeutic activities;Therapeutic exercise;Balance training;Neuromuscular re-education;Patient/family education;Dry needling;Passive range of motion;Taping;Joint Manipulations    PT Next Visit Plan  Establish new STG and LTG; progress per OATs protocol; continue to progress closed chain low range erxercises and propriception/ external focus; assess tolerance to a deeper step    PT Home Exercise Plan  Supine heel slide with overpressure at end range, prone quad stretch with strap,  supine hamstring stretch with strap, 4-way SLR, SL  balance    Consulted and Agree with Plan of Care  Patient       Patient will benefit from skilled therapeutic intervention in order to improve the following deficits and impairments:  Abnormal gait, Difficulty walking, Pain, Decreased mobility, Decreased strength, Decreased activity tolerance, Decreased range of motion  Visit Diagnosis: Stiffness of right knee, not elsewhere classified  Other abnormalities of gait and mobility  Localized edema  Acute pain of right knee     Problem List Patient Active Problem List   Diagnosis Date Noted  . 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 Locking of right knee 05/02/2019    Dessie Coma PT DPT  10/18/2019, 10:50 AM  [facility] [address] [place], [place], [address] Phone: [phone]   Fax:  [phone]  Name: LABRANDON KNOCH MRN: [id number] Date of Birth: October 18, 2000

## 2019-10-25 ENCOUNTER — Ambulatory Visit: Payer: Medicaid Other | Admitting: Physical Therapy

## 2019-10-25 ENCOUNTER — Encounter: Payer: Self-pay | Admitting: Physical Therapy

## 2019-10-25 ENCOUNTER — Other Ambulatory Visit: Payer: Self-pay

## 2019-10-25 DIAGNOSIS — M25661 Stiffness of right knee, not elsewhere classified: Secondary | ICD-10-CM

## 2019-10-25 DIAGNOSIS — R2689 Other abnormalities of gait and mobility: Secondary | ICD-10-CM

## 2019-10-25 DIAGNOSIS — R600 Localized edema: Secondary | ICD-10-CM

## 2019-10-25 DIAGNOSIS — M25561 Pain in right knee: Secondary | ICD-10-CM

## 2019-10-25 NOTE — Therapy (Signed)
[facility] [address] [place], [place], [address] Phone: [phone]   Fax:  [phone]  Physical Therapy Treatment  Patient Details  Name: Micheal Bentley MRN: [id number] Date of Birth: 2000-11-09 Referring Provider (PT): Dr Cammy Copa    Encounter Date: 10/25/2019  PT End of Session - 10/25/19 1008    Visit Number  20    Number of Visits  22    Date for PT Re-Evaluation  11/02/19    Authorization Type  MCD    Authorization Time Period  approved 6 from 09/26/2019 to 11/06/2019    PT Start Time  0930    PT Stop Time  1010    PT Time Calculation (min)  40 min    Activity Tolerance  Patient tolerated treatment well    Behavior During Therapy  [facility] for tasks assessed/performed       Past Medical History:  Diagnosis Date  . Asthma    Not an active issue  . Seasonal allergies     Past Surgical History:  Procedure Laterality Date  . ANTERIOR CRUCIATE LIGAMENT REPAIR Right 05/29/2019   Procedure: RIGHT KNEE RECONSTRUCTION ANTERIOR CRUCIATE LIGAMENT (ACL) WITH HAMSTRING AUTOGRAFT, MENISCAL REPAIR;  Surgeon: Cammy Copa, MD;  [facility];  Service: Orthopedics;  Laterality: Right;  . NO PAST SURGERIES    . OSTEOCHONDRAL DEFECT REPAIR/RECONSTRUCTION Right 07/11/2019   Procedure: right knee open osteochondral defect placement with bone  marrow aspiration;  Surgeon: Cammy Copa, MD;  Location: [facility];  Service: Orthopedics;  Laterality: Right;    There were no vitals filed for this visit.  Subjective Assessment - 10/25/19 1004    Subjective  Patient had no pain after her last visit. He has had no pain with any activity    How long can you stand comfortably?  no limit on crutches    How long can you walk comfortably?  no limit    Diagnostic tests  nothing post op    Patient Stated Goals  run and jog    Currently in Pain?  No/denies                       [name]  Adult PT Treatment/Exercise - 10/25/19 0001      Ambulation/Gait   Gait Comments  straight line running 4 laps 100' each improved single leg stance and confidence; low skip x40' side shuffle 40'x2; kareoke step x40; punch stpes x10; doublew punch x5 had minor pain which cleared       Knee/Hip Exercises: Machines for Strengthening   Cybex Leg Press  120 3x10       Knee/Hip Exercises: Standing   Other Standing Knee Exercises  d2 kettle bell 10lb     Other Standing Knee Exercises  cable walk 16 fwd 5 back;t 2 laps;;  45lb squat to the floor x20; rebounder 2x20; ;eccentroic step down x20;              PT Education - 10/25/19 1008    Education Details  reviewed HEP and symptom mangement    Person(s) Educated  Patient    Methods  Explanation;Demonstration;Tactile cues;Verbal cues    Comprehension  Verbalized understanding;Returned demonstration;Verbal cues required;Tactile cues required       PT Short Term Goals - 09/21/19 1312      PT SHORT TERM GOAL #1   Title  Patient will increase passive knee flexion by 15 degrees  Baseline  116 degrees 08/30/2018    Time  3    Period  Weeks    Status  Achieved    Target Date  08/15/19      PT SHORT TERM GOAL #2   Title  Patient will begin partial weight bearing    Baseline  full weight bearing without difficulty    Time  3    Period  Weeks    Status  Achieved    Target Date  08/15/19      PT SHORT TERM GOAL #3   Title  Patient will begin exercises per MD recocmendation    Baseline  unable to perfrorm ther-ex    Time  3    Period  Weeks    Status  Achieved      PT SHORT TERM GOAL #4   Title  therapy will estabilish functional long and short term gaosl when able to assess        PT Long Term Goals - 10/18/19 1006      PT LONG TERM GOAL #1   Title  Patient will begin straight line running    Baseline  not cleared at this time    Time  6    Period  Weeks    Status  Achieved      PT LONG TERM GOAL #2   Title  Patient  will demonstrate good dynamic stability with single leg stance activity    Baseline  fair control with dynamic single leg stability    Time  6    Period  Weeks    Status  New      PT LONG TERM GOAL #3   Title  Patient will be independent with advanced strengthening and stability program to advance him to return to sport activity    Baseline  is progressing but is missing a straight line running component.    Time  6    Period  Weeks    Status  New            Plan - 10/25/19 1009    Clinical Impression Statement  The patient is making great progress. He had increased confidence in his leg with straight line running and light warm up drills. He had no pain with running drills. Therapy had him work on punch steps which causewd minor discomfort. The patrient has a follow up next week with the MD. He is back to straight line running. He is likely still a few months away from safe return to sport. Therapy will review       Patient will benefit from skilled therapeutic intervention in order to improve the following deficits and impairments:     Visit Diagnosis: Stiffness of right knee, not elsewhere classified  Other abnormalities of gait and mobility  Localized edema  Acute pain of right knee     Problem List Patient Active Problem List   Diagnosis Date Noted  . 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 Locking of right knee 05/02/2019    Carney Living PT DPT  10/25/2019, 11:36 AM  [facility] [address] [place], [name], [address] Phone: [phone]   Fax:  [phone]  Name: Micheal Bentley MRN: [id number] Date of Birth: 06-15-2001

## 2019-11-02 ENCOUNTER — Other Ambulatory Visit: Payer: Self-pay

## 2019-11-02 ENCOUNTER — Ambulatory Visit (INDEPENDENT_AMBULATORY_CARE_PROVIDER_SITE_OTHER): Payer: Medicaid Other | Admitting: Orthopedic Surgery

## 2019-11-02 DIAGNOSIS — S83511D Sprain of anterior cruciate ligament of right knee, subsequent encounter: Secondary | ICD-10-CM

## 2019-11-02 DIAGNOSIS — M958 Other specified acquired deformities of musculoskeletal system: Secondary | ICD-10-CM

## 2019-11-03 ENCOUNTER — Encounter: Payer: Self-pay | Admitting: Orthopedic Surgery

## 2019-11-03 NOTE — Progress Notes (Signed)
Office Visit Note   Patient: Micheal Bentley           Date of Birth: 2001-03-07           MRN: [id number] Visit Date: 11/02/2019 Requested by: [facility] [address] [place],  [place] [address] PCP: [facility]  Subjective: Chief Complaint  Patient presents with  . Right Knee - Follow-up    HPI: Micheal Bentley is a 19 y.o. male who presents to the office s/p right knee open osteochondral defect placement with bone marrow aspiration on 07/11/2019.  He is 4 months out from this procedure.  Additionally he is 5 months out from right knee ACL reconstruction.  Patient notes that his knee no longer gives out on him as it did at his last visit.  He is going to physical therapy 1 time a week to work on strengthening and using an elliptical.  He is doing 95 pounds back squats and he has dog twice in the past week on a flat surface.  He is not having to take any medication for pain and denies any pain..                ROS:  All systems reviewed are negative as they relate to the chief complaint within the history of present illness.  Patient denies fevers or chills.  Assessment & Plan: Visit Diagnoses:  1. Rupture of anterior cruciate ligament of right knee, subsequent encounter   2. Osteochondral defect of femoral condyle     Plan: Patient is a 19 year old male who presents 5 months out from right knee ACL reconstruction and 4 months out from right knee osteochondral defect placement.  He is doing well with excellent range of motion and little to no pain.  He does have a small effusion.  Graft continues to be stable on exam.  In regards to restrictions, patient is okay for squats but hold off on running and any sort of jumping, such as jump rope for now.  Plan for patient to return to the office in 2 months for clinical recheck with x-rays at that point.  Patient agreed with plan.  Follow-Up Instructions: No follow-ups on file.     Orders:  No orders of the defined types were placed in this encounter.  No orders of the defined types were placed in this encounter.     Procedures: No procedures performed   Clinical Data: No additional findings.  Objective: Vital Signs: There were no vitals taken for this visit.  Physical Exam:  Constitutional: Patient appears well-developed HEENT:  Head: Normocephalic Eyes:EOM are normal Neck: Normal range of motion Cardiovascular: Normal rate Pulmonary/chest: Effort normal Neurologic: Patient is alert Skin: Skin is warm Psychiatric: Patient has normal mood and affect  Ortho Exam:  Well-healed incisions from prior right knee surgeries. 0 degrees of extension Greater than 115 degrees of flexion Small effusion present ACL graft is stable on Lachman exam No tenderness to palpation over the medial or lateral joint lines  Specialty Comments:  No specialty comments available.  Imaging: No results found.   PMFS History: Patient Active Problem List   Diagnosis Date Noted  . 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 Asthma    Not an active issue  . Seasonal allergies     Family History  Problem Relation Age of Onset  .  Diabetes Father     Past Surgical History:  Procedure Laterality Date  . ANTERIOR CRUCIATE LIGAMENT REPAIR Right 05/29/2019   Procedure: RIGHT KNEE RECONSTRUCTION ANTERIOR CRUCIATE LIGAMENT (ACL) WITH HAMSTRING AUTOGRAFT, MENISCAL REPAIR;  Surgeon: Cammy Copa, MD;  [facility];  Service: Orthopedics;  Laterality: Right;  . NO PAST SURGERIES    . OSTEOCHONDRAL DEFECT REPAIR/RECONSTRUCTION Right 07/11/2019   Procedure: right knee open osteochondral defect placement with bone  marrow aspiration;  Surgeon: Cammy Copa, MD;  Location: [facility];  Service: Orthopedics;  Laterality: Right;   Social History    Occupational History  . Not on file  Tobacco Use  . Smoking status: Never Smoker  . Smokeless tobacco: Never Used  Substance and Sexual Activity  . Alcohol use: No  . Drug use: No  . Sexual activity: Not on file

## 2019-11-08 ENCOUNTER — Encounter: Payer: Self-pay | Admitting: Physical Therapy

## 2019-11-08 ENCOUNTER — Ambulatory Visit: Payer: Medicaid Other | Admitting: Physical Therapy

## 2019-11-08 ENCOUNTER — Other Ambulatory Visit: Payer: Self-pay

## 2019-11-08 DIAGNOSIS — M25661 Stiffness of right knee, not elsewhere classified: Secondary | ICD-10-CM | POA: Diagnosis not present

## 2019-11-08 DIAGNOSIS — M25561 Pain in right knee: Secondary | ICD-10-CM

## 2019-11-08 DIAGNOSIS — R600 Localized edema: Secondary | ICD-10-CM

## 2019-11-08 DIAGNOSIS — R2689 Other abnormalities of gait and mobility: Secondary | ICD-10-CM

## 2019-11-08 NOTE — Therapy (Signed)
[place], [name], [address] Phone: [phone]   Fax:  [phone]                                                                   Physical Therapy Treatment/Discharge  Patient Details  Name: Micheal Bentley MRN: [id number] Date of Birth: 05-20-2001 Referring Provider (PT): Dr Meredith Pel    Encounter Date: 11/08/2019  PT End of Session - 11/08/19 0912    Visit Number  21    Number of Visits  22    Date for PT Re-Evaluation  11/02/19    Authorization Time Period  approved 6 from 09/26/2019 to 11/06/2019    Authorization - Visit Number  1    Authorization - Number of Visits  6    Activity Tolerance  Patient tolerated treatment well    Behavior During Therapy  [facility] for tasks assessed/performed       Past Medical History:  Diagnosis Date  . Asthma    Not an active issue  . Seasonal allergies     Past Surgical History:  Procedure Laterality Date  . ANTERIOR CRUCIATE LIGAMENT REPAIR Right 05/29/2019   Procedure: RIGHT KNEE RECONSTRUCTION ANTERIOR CRUCIATE LIGAMENT (ACL) WITH HAMSTRING AUTOGRAFT, MENISCAL REPAIR;  Surgeon: Meredith Pel, MD;  Location: [place];  Service: Orthopedics;  Laterality: Right;  . NO PAST SURGERIES    . OSTEOCHONDRAL DEFECT REPAIR/RECONSTRUCTION Right 07/11/2019   Procedure: right knee open osteochondral defect placement with bone  marrow aspiration;  Surgeon: Meredith Pel, MD;  Location: [place];  Service: Orthopedics;  Laterality: Right;    There were no vitals filed for this visit.  Subjective Assessment - 11/08/19 0910    Subjective  Patient has been back to the MD. He is not alloweed to run for another 2 months. He has had no pain. He is back to the gym and working out.    How long can you stand comfortably?  no limit on crutches    How long can you walk comfortably?  no limit    Diagnostic tests  nothing post op    Patient Stated  Goals  run and jog    Currently in Pain?  No/denies                       [facility] Adult PT Treatment/Exercise - 11/08/19 0001      Knee/Hip Exercises: Aerobic   Elliptical  5 min       Knee/Hip Exercises: Machines for Strengthening   Cybex Knee Extension  35lbs 2x15 reviewed proper set up for the gym       Knee/Hip Exercises: Standing   Other Standing Knee Exercises  cone drill 2x15 right; Kettle bell swing x20; lateral band walk x20 black; monster walk x20 bblack.     Other Standing Knee Exercises  cable walk 16 fwd 5 back;t 2 laps;;  45lb squat to the floor 2x20; rebounder 2x20; ;eccentroic step down x20;       Knee/Hip Exercises: Supine   Bridges Limitations  2x20 on ex ball     Straight Leg Raises Limitations  4lb 2x20  PT Education - 11/08/19 0911    Education Details  HEP and symptom mangement    Person(s) Educated  Patient    Methods  Explanation;Demonstration;Tactile cues;Verbal cues    Comprehension  Verbalized understanding;Returned demonstration;Verbal cues required;Tactile cues required       PT Short Term Goals - 09/21/19 1312      PT SHORT TERM GOAL #1   Title  Patient will increase passive knee flexion by 15 degrees    Baseline  116 degrees 08/30/2018    Time  3    Period  Weeks    Status  Achieved    Target Date  08/15/19      PT SHORT TERM GOAL #2   Title  Patient will begin partial weight bearing    Baseline  full weight bearing without difficulty    Time  3    Period  Weeks    Status  Achieved    Target Date  08/15/19      PT SHORT TERM GOAL #3   Title  Patient will begin exercises per MD recocmendation    Baseline  unable to perfrorm ther-ex    Time  3    Period  Weeks    Status  Achieved      PT SHORT TERM GOAL #4   Title  therapy will estabilish functional long and short term gaosl when able to assess        PT Long Term Goals - 10/18/19 1006      PT LONG TERM GOAL #1   Title  Patient will begin  straight line running    Baseline  not cleared at this time    Time  6    Period  Weeks    Status  Achieved      PT LONG TERM GOAL #2   Title  Patient will demonstrate good dynamic stability with single leg stance activity    Baseline  fair control with dynamic single leg stability    Time  6    Period  Weeks    Status  New      PT LONG TERM GOAL #3   Title  Patient will be independent with advanced strengthening and stability program to advance him to return to sport activity    Baseline  is progressing but is missing a straight line running component.    Time  6    Period  Weeks    Status  New            Plan - 11/08/19 0912    Clinical Impression Statement  Patient has reached max benefit form PT at this time. He has a full lifting and single leg stability program that he has been perfroming on his own. He is not able to run for 2 more months. He woill continue to train for 2 more motnhs. If the patient feels uncomfotbale running at that time please re-consult for a return to running program.    Examination-Activity Limitations  Stand;Locomotion Level;Stairs;Squat    Stability/Clinical Decision Making  Stable/Uncomplicated    Clinical Decision Making  Low    Rehab Potential  Excellent    PT Frequency  1x / week    PT Duration  6 weeks    PT Treatment/Interventions  ADLs/Self Care Home Management;Cryotherapy;Electrical Stimulation;Iontophoresis 66m/ml Dexamethasone;Moist Heat;Manual techniques;Gait training;Therapeutic activities;Therapeutic exercise;Balance training;Neuromuscular re-education;Patient/family education;Dry needling;Passive range of motion;Taping;Joint Manipulations    PT Next Visit Plan  D/C to HEP    PT Home  Exercise Plan  Supine heel slide with overpressure at end range, prone quad stretch with strap, supine hamstring stretch with strap, 4-way SLR, SL balance    Consulted and Agree with Plan of Care  Patient       Patient will benefit from skilled  therapeutic intervention in order to improve the following deficits and impairments:  Abnormal gait, Difficulty walking, Pain, Decreased mobility, Decreased strength, Decreased activity tolerance, Decreased range of motion  Visit Diagnosis: No diagnosis found.  PHYSICAL THERAPY DISCHARGE SUMMARY  Visits from Start of Care: 21  Current functional level related to goals / functional outcomes: Significant improvement in single leg stability, ROM, and strength   Remaining deficits: Not back to running yet    Education / Equipment: HEP Plan: Patient agrees to discharge.  Patient goals were met. Patient is being discharged due to meeting the stated rehab goals.  ?????       Problem List Patient Active Problem List   Diagnosis Date Noted  . 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 Locking of right knee 05/02/2019    Carney Living PT DPT  11/08/2019, 3:44 PM  [facility] [address] [place], [name], [address] Phone: [phone]   Fax:  [phone]  Name: Micheal Bentley MRN: [id number] Date of Birth: 2001-03-27

## 2020-01-03 ENCOUNTER — Other Ambulatory Visit: Payer: Self-pay

## 2020-01-03 ENCOUNTER — Ambulatory Visit (INDEPENDENT_AMBULATORY_CARE_PROVIDER_SITE_OTHER): Payer: Medicaid Other | Admitting: Orthopedic Surgery

## 2020-01-03 ENCOUNTER — Encounter: Payer: Self-pay | Admitting: Orthopedic Surgery

## 2020-01-03 DIAGNOSIS — S83511D Sprain of anterior cruciate ligament of right knee, subsequent encounter: Secondary | ICD-10-CM | POA: Diagnosis not present

## 2020-01-03 NOTE — Progress Notes (Signed)
Office Visit Note   Patient: Micheal Bentley           Date of Birth: 2000/12/08           MRN: [id number] Visit Date: 01/03/2020 Requested by: [facility] [address] [place],  [place] [address] PCP: [facility]  Subjective: Chief Complaint  Patient presents with  . Right Knee - Pain    HPI: Micheal Bentley is a patient who is now about 6 months out right knee osteochondral defect implantation and ACL reconstruction.  He is doing well.  He is out of physical therapy.  Doing leg press and squats as well as calf raises and elliptical.  Not taking any medication.              ROS: All systems reviewed are negative as they relate to the chief complaint within the history of present illness.  Patient denies  fevers or chills.   Assessment & Plan: Visit Diagnoses:  1. Rupture of anterior cruciate ligament of right knee, subsequent encounter     Plan: Impression is patient has mild effusion from OCD lesion ACL reconstruction treatment.  In general he is doing well.  Range of motion graft stability excellent.  We will let him start doing a little bit of straightahead running in 6 weeks.  Come back in 3 months for final check.  No cutting or pivoting yet.  Follow-Up Instructions: Return in about 3 months (around 04/04/2020).   Orders:  No orders of the defined types were placed in this encounter.  No orders of the defined types were placed in this encounter.     Procedures: No procedures performed   Clinical Data: No additional findings.  Objective: Vital Signs: There were no vitals taken for this visit.  Physical Exam:   Constitutional: Patient appears well-developed HEENT:  Head: Normocephalic Eyes:EOM are normal Neck: Normal range of motion Cardiovascular: Normal rate Pulmonary/chest: Effort normal Neurologic: Patient is alert Skin: Skin is warm Psychiatric: Patient has normal mood and affect    Ortho Exam:  Ortho exam demonstrates full active and passive range of motion of the right knee.  Mild effusion is present.  Collateral crucial ligaments are stable.  Extensor mechanism is intact.  Graft has great stability to Lachman testing with 2 mm anterior translation solid endpoint.  Specialty Comments:  No specialty comments available.  Imaging: No results found.   PMFS History: Patient Active Problem List   Diagnosis Date Noted  . 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 Asthma    Not an active issue  . Seasonal allergies     Family History  Problem Relation Age of Onset  . Diabetes Father     Past Surgical History:  Procedure Laterality Date  . ANTERIOR CRUCIATE LIGAMENT REPAIR Right 05/29/2019   Procedure: RIGHT KNEE RECONSTRUCTION ANTERIOR CRUCIATE LIGAMENT (ACL) WITH HAMSTRING AUTOGRAFT, MENISCAL REPAIR;  Surgeon: Cammy Copa, MD;  Location: [facility];  Service: Orthopedics;  Laterality: Right;  . NO PAST SURGERIES    . OSTEOCHONDRAL DEFECT REPAIR/RECONSTRUCTION Right 07/11/2019   Procedure: right knee open osteochondral defect placement with bone  marrow aspiration;  Surgeon: Cammy Copa, MD;  Location: [facility];  Service: Orthopedics;  Laterality: Right;   Social History   Occupational History  . Not on file  Tobacco Use  . Smoking  status: Never Smoker  . Smokeless tobacco: Never Used  Substance and Sexual Activity  . Alcohol use: No  . Drug use: No  . Sexual activity: Not on file

## 2020-01-24 ENCOUNTER — Other Ambulatory Visit: Payer: Self-pay

## 2020-01-24 ENCOUNTER — Ambulatory Visit: Payer: Self-pay

## 2020-01-24 ENCOUNTER — Ambulatory Visit (INDEPENDENT_AMBULATORY_CARE_PROVIDER_SITE_OTHER): Payer: Medicaid Other | Admitting: Orthopedic Surgery

## 2020-01-24 DIAGNOSIS — Z9889 Other specified postprocedural states: Secondary | ICD-10-CM

## 2020-01-24 DIAGNOSIS — M25561 Pain in right knee: Secondary | ICD-10-CM

## 2020-01-24 MED ORDER — MELOXICAM 15 MG PO TABS: 15.0000 mg | ORAL_TABLET | Freq: Every day | ORAL | 0 refills | Status: DC

## 2020-01-25 ENCOUNTER — Encounter: Payer: Self-pay | Admitting: Orthopedic Surgery

## 2020-01-25 NOTE — Progress Notes (Signed)
Office Visit Note   Patient: Micheal Bentley           Date of Birth: Jan 04, 2001           MRN: [id number] Visit Date: 01/24/2020 Requested by: [facility] [address] [place],  [place] [address] PCP: [facility]  Subjective: Chief Complaint  Patient presents with  . Follow-up    HPI: Micheal Bentley is a 19 y.o. male who presents to the office complaining of right knee pain.  Patient is s/p right knee osteochondral defect placement with bone marrow aspiration on 07/11/2019 as well as s/p right knee ACL reconstruction with hamstring autograft and medial meniscal repair on 05/29/2019.  Patient notes that 2 weeks ago he felt like something moved in his knee while on a recumbent bicycle.  He could not bend his knee for about 3 days.  He was doing 95 pounds squats prior to using the bike.  He localizes pain to the medial aspect of the right knee.  He denies any instability but does note catching in the knee.  He is not taking any medications.  He is not doing any workouts right now.  He is able to walk but he feels like he has to guard the knee at times while ambulating..                ROS:  All systems reviewed are negative as they relate to the chief complaint within the history of present illness.  Patient denies fevers or chills.  Assessment & Plan: Visit Diagnoses:  1. Right knee pain, unspecified chronicity   2. S/P medial meniscal repair     Plan: Patient is a 19 year old male who presents complaining of right knee pain following an injury while using recumbent bicycle 2 weeks ago.  He has catching sensations in the right knee as well as pain over the medial aspect which is a new finding for him..  This is the same part of the knee with medial meniscal repair in October 2020.  He does have an effusion on exam.  He has mild medial joint line tenderness.  ACL graft is stable with a good endpoint on exam.  With his  catching, mechanical symptoms and medial joint line tenderness, ordered MRI of the right knee to evaluate medial meniscal repair.  Patient agreed with plan.  Follow-up after MRI to review results.  Also prescribed Mobic to help with patient's symptoms.  This may require arthroscopic intervention on that meniscal repair.  Mechanical symptoms less likely to be due to the allograft but we will have to evaluate at that time.  Follow-Up Instructions: No follow-ups on file.   Orders:  Orders Placed This Encounter  Procedures  . XR KNEE 3 VIEW RIGHT  . MR Knee Right w/o contrast   Meds ordered this encounter  Medications  . meloxicam (MOBIC) 15 MG tablet    Sig: Take 1 tablet (15 mg total) by mouth daily.    Dispense:  30 tablet    Refill:  0      Procedures: No procedures performed   Clinical Data: No additional findings.  Objective: Vital Signs: There were no vitals taken for this visit.  Physical Exam:  Constitutional: Patient appears well-developed HEENT:  Head: Normocephalic Eyes:EOM are normal Neck: Normal range of motion Cardiovascular: Normal rate Pulmonary/chest: Effort normal Neurologic: Patient is alert Skin: Skin is warm Psychiatric: Patient has normal mood and affect  Ortho Exam:  Right knee Exam Positive effusion Tenderness to palpation over the medial joint line Extensor mechanism intact No TTP over the lateral jointlines, quad tendon, patellar tendon, pes anserinus, patella, tibial tubercle, LCL/MCL insertions Stable to varus/valgus stresses.  Stable to anterior/posterior drawer.  Stable on Lachman exam. Extension to 0 degrees Flexion > 90 degrees  Specialty Comments:  No specialty comments available.  Imaging: No results found.   PMFS History: Patient Active Problem List   Diagnosis Date Noted  . 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 Asthma    Not an  active issue  . Seasonal allergies     Family History  Problem Relation Age of Onset  . Diabetes Father     Past Surgical History:  Procedure Laterality Date  . ANTERIOR CRUCIATE LIGAMENT REPAIR Right 05/29/2019   Procedure: RIGHT KNEE RECONSTRUCTION ANTERIOR CRUCIATE LIGAMENT (ACL) WITH HAMSTRING AUTOGRAFT, MENISCAL REPAIR;  Surgeon: Cammy Copa, MD;  Location: [facility];  Service: Orthopedics;  Laterality: Right;  . NO PAST SURGERIES    . OSTEOCHONDRAL DEFECT REPAIR/RECONSTRUCTION Right 07/11/2019   Procedure: right knee open osteochondral defect placement with bone  marrow aspiration;  Surgeon: Cammy Copa, MD;  Location: [facility];  Service: Orthopedics;  Laterality: Right;   Social History   Occupational History  . Not on file  Tobacco Use  . Smoking status: Never Smoker  . Smokeless tobacco: Never Used  Vaping Use  . Vaping Use: Never used  Substance and Sexual Activity  . Alcohol use: No  . Drug use: No  . Sexual activity: Not on file

## 2020-01-26 ENCOUNTER — Encounter: Payer: Self-pay | Admitting: Orthopedic Surgery

## 2020-02-15 DIAGNOSIS — Z419 Encounter for procedure for purposes other than remedying health state, unspecified: Secondary | ICD-10-CM | POA: Diagnosis not present

## 2020-02-26 ENCOUNTER — Ambulatory Visit
Admission: RE | Admit: 2020-02-26 | Discharge: 2020-02-26 | Disposition: A | Discharge status: Home / Self Care | Payer: Medicaid Other | Source: Ambulatory Visit | Attending: Orthopedic Surgery | Admitting: Orthopedic Surgery

## 2020-02-26 ENCOUNTER — Other Ambulatory Visit: Payer: Self-pay

## 2020-02-26 DIAGNOSIS — M25561 Pain in right knee: Secondary | ICD-10-CM

## 2020-03-17 DIAGNOSIS — Z419 Encounter for procedure for purposes other than remedying health state, unspecified: Secondary | ICD-10-CM | POA: Diagnosis not present

## 2020-04-04 ENCOUNTER — Ambulatory Visit: Payer: Medicaid Other | Admitting: Orthopedic Surgery

## 2020-04-09 DIAGNOSIS — Z23 Encounter for immunization: Secondary | ICD-10-CM | POA: Diagnosis not present

## 2020-04-17 DIAGNOSIS — Z419 Encounter for procedure for purposes other than remedying health state, unspecified: Secondary | ICD-10-CM | POA: Diagnosis not present

## 2020-04-24 ENCOUNTER — Ambulatory Visit (INDEPENDENT_AMBULATORY_CARE_PROVIDER_SITE_OTHER): Payer: Medicaid Other | Admitting: Orthopedic Surgery

## 2020-04-24 DIAGNOSIS — M25561 Pain in right knee: Secondary | ICD-10-CM

## 2020-04-27 ENCOUNTER — Encounter: Payer: Self-pay | Admitting: Orthopedic Surgery

## 2020-04-27 NOTE — Progress Notes (Signed)
Office Visit Note   Patient: Micheal Bentley           Date of Birth: October 23, 2000           MRN: [id number] Visit Date: 04/24/2020 Requested by: [facility] [address] [place],  [place] [address] PCP: [facility]  Subjective: Chief Complaint  Patient presents with   Left Knee - Follow-up    HPI: Patient presents for follow-up of right knee pain.  Approximately 9 months ago he had open osteochondral defect placement with bone marrow aspiration and ACL reconstruction done about 6 weeks before that.  He is doing well.  Was having some mechanical symptoms.  Had a meniscal repair at that time as well.  MRI scan is reviewed and it shows osteochondral defect in good position graft in good position and no evidence of meniscal retear.              ROS: All systems reviewed are negative as they relate to the chief complaint within the history of present illness.  Patient denies  fevers or chills.   Assessment & Plan: Visit Diagnoses:  1. Right knee pain, unspecified chronicity     Plan: Impression is no structural problem with the right knee following several episodes of mechanical symptoms.  No indication for further surgical intervention at this time.  Reviewed the scan with the patient.  Plan is observation.  No effusion today on exam.  I do think he needs to be very careful with the type of weightbearing activity he does.  Running for exercise is not advised.  In general he is had ACL reconstruction large chondral defect treated as well as partial meniscal resection and subsequent repair of what was left.  This is not a knee that really can withstand a lot of pounding in the long-term.  All this is discussed and I will see him back as needed.  Follow-Up Instructions: Return if symptoms worsen or fail to improve.   Orders:  No orders of the defined types were placed in this encounter.  No orders of the defined types were placed  in this encounter.     Procedures: No procedures performed   Clinical Data: No additional findings.  Objective: Vital Signs: There were no vitals taken for this visit.  Physical Exam:   Constitutional: Patient appears well-developed HEENT:  Head: Normocephalic Eyes:EOM are normal Neck: Normal range of motion Cardiovascular: Normal rate Pulmonary/chest: Effort normal Neurologic: Patient is alert Skin: Skin is warm Psychiatric: Patient has normal mood and affect    Ortho Exam: Ortho exam demonstrates full active and passive range of motion of the right knee.  Trace effusion present.  Collateral crucial ligaments are stable.  No joint line tenderness.  ACL graft is stable.  Extensor mechanism is intact.  Specialty Comments:  No specialty comments available.  Imaging: No results found.   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/2019   Past Medical History:  Diagnosis Date   Asthma    Not an active issue   Seasonal allergies     Family History  Problem Relation Age of Onset   Diabetes Father     Past Surgical History:  Procedure Laterality Date   ANTERIOR CRUCIATE LIGAMENT REPAIR Right 05/29/2019   Procedure: RIGHT KNEE RECONSTRUCTION ANTERIOR CRUCIATE LIGAMENT (ACL) WITH HAMSTRING AUTOGRAFT, MENISCAL  REPAIR;  Surgeon: Cammy Copa, MD;  Location: [facility] OR;  Service: Orthopedics;  Laterality: Right;   NO PAST SURGERIES     OSTEOCHONDRAL DEFECT REPAIR/RECONSTRUCTION Right 07/11/2019   Procedure: right knee open osteochondral defect placement with bone  marrow aspiration;  Surgeon: Cammy Copa, MD;  Location: [facility];  Service: Orthopedics;  Laterality: Right;   Social History   Occupational History   Not on file  Tobacco Use   Smoking status: Never Smoker   Smokeless tobacco: Never Used  Vaping Use   Vaping  Use: Never used  Substance and Sexual Activity   Alcohol use: No   Drug use: No   Sexual activity: Not on file

## 2020-05-17 DIAGNOSIS — Z419 Encounter for procedure for purposes other than remedying health state, unspecified: Secondary | ICD-10-CM | POA: Diagnosis not present

## 2020-06-17 DIAGNOSIS — Z419 Encounter for procedure for purposes other than remedying health state, unspecified: Secondary | ICD-10-CM | POA: Diagnosis not present

## 2020-07-17 DIAGNOSIS — Z419 Encounter for procedure for purposes other than remedying health state, unspecified: Secondary | ICD-10-CM | POA: Diagnosis not present

## 2020-08-17 DIAGNOSIS — Z419 Encounter for procedure for purposes other than remedying health state, unspecified: Secondary | ICD-10-CM | POA: Diagnosis not present

## 2020-09-17 DIAGNOSIS — Z419 Encounter for procedure for purposes other than remedying health state, unspecified: Secondary | ICD-10-CM | POA: Diagnosis not present

## 2020-10-15 DIAGNOSIS — Z419 Encounter for procedure for purposes other than remedying health state, unspecified: Secondary | ICD-10-CM | POA: Diagnosis not present

## 2020-11-15 DIAGNOSIS — Z419 Encounter for procedure for purposes other than remedying health state, unspecified: Secondary | ICD-10-CM | POA: Diagnosis not present

## 2020-12-15 DIAGNOSIS — Z419 Encounter for procedure for purposes other than remedying health state, unspecified: Secondary | ICD-10-CM | POA: Diagnosis not present

## 2021-01-15 DIAGNOSIS — Z419 Encounter for procedure for purposes other than remedying health state, unspecified: Secondary | ICD-10-CM | POA: Diagnosis not present

## 2021-02-14 DIAGNOSIS — Z419 Encounter for procedure for purposes other than remedying health state, unspecified: Secondary | ICD-10-CM | POA: Diagnosis not present

## 2021-03-17 DIAGNOSIS — Z419 Encounter for procedure for purposes other than remedying health state, unspecified: Secondary | ICD-10-CM | POA: Diagnosis not present

## 2021-04-17 DIAGNOSIS — Z419 Encounter for procedure for purposes other than remedying health state, unspecified: Secondary | ICD-10-CM | POA: Diagnosis not present

## 2021-04-27 IMAGING — MR MR KNEE*R* W/O CM
4 of 7 series · 21 of 40 positions shown · non-contrast
Comparison: Plain films right knee 01/24/2020. MRI right knee
05/13/2019.

CLINICAL DATA: The patient underwent ACL repair 05/29/2019 and
repair of an osteochondral defect in the right knee 07/11/2019 after
injury suffered playing soccer. Pinching sensation in the right knee
with intermittent swelling for approximately 1 month. No new injury.

EXAM:
MRI OF THE RIGHT KNEE WITHOUT CONTRAST
TECHNIQUE: Multiplanar, multisequence MR imaging of the knee was performed. No
intravenous contrast was administered.

[Series 3: T2 fat-sat · axial · 4.0mm · 0.31mm/px · z∈[-49,+69]mm · 5 of 28 slices shown]
[im 1/28]
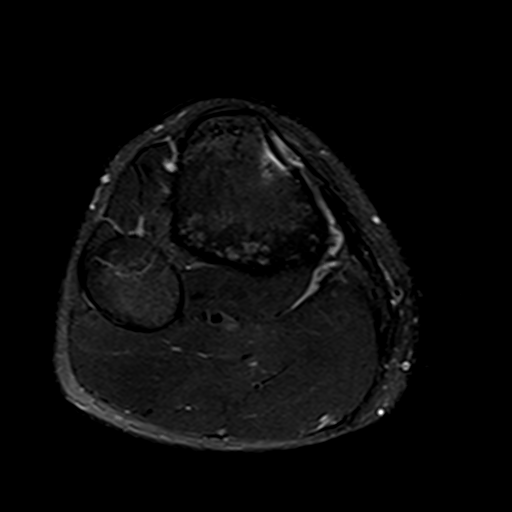
[im 6/28]
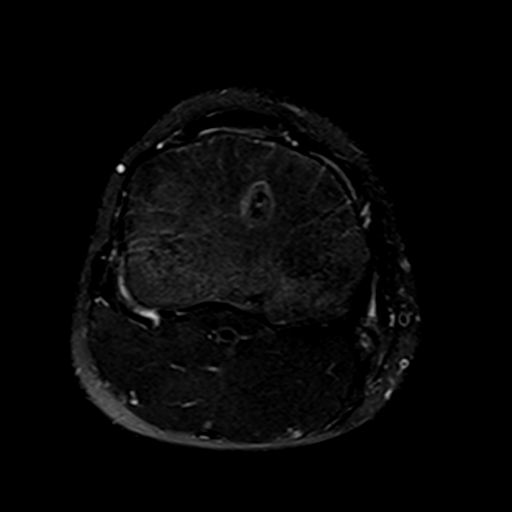
[im 11/28]
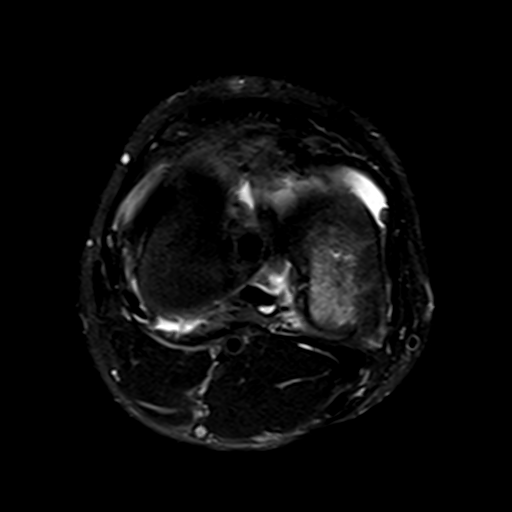
[im 17/28]
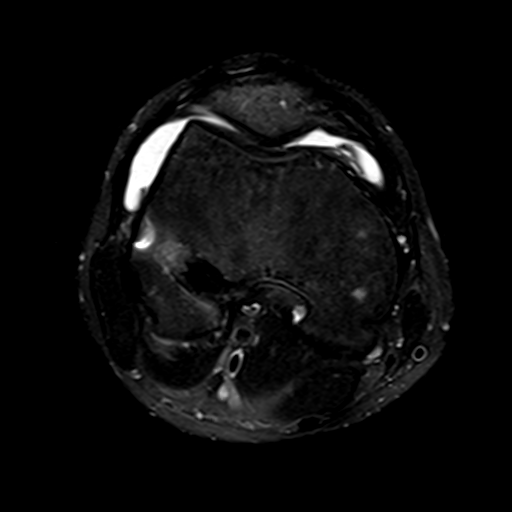
[im 28/28]
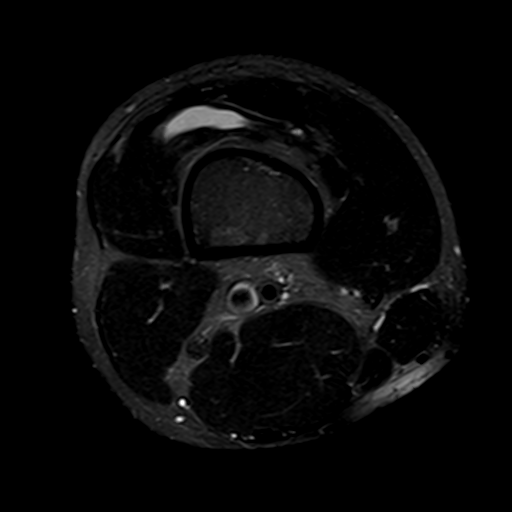

[Series 6: PD fat-sat · coronal · 4.0mm · 0.39mm/px · 6 of 24 slices shown (1 of 3)]
[im 1/24]
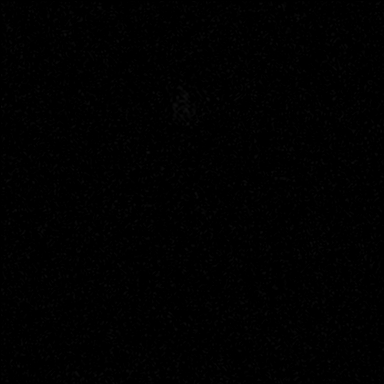
[im 5/24]
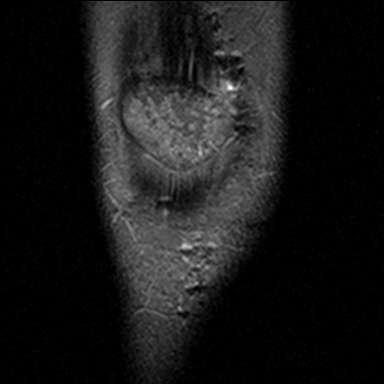
[im 10/24]
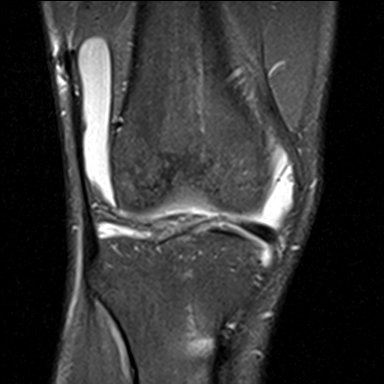
[im 14/24]
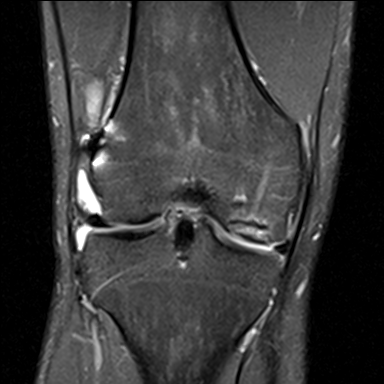
[im 19/24]
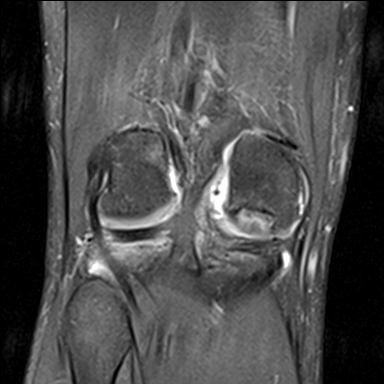
[im 24/24]
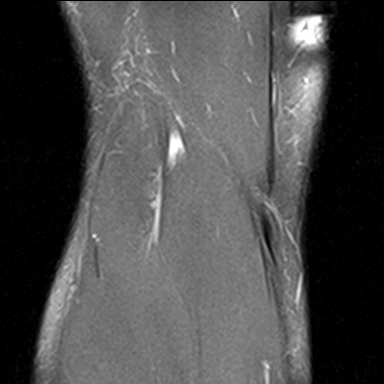

[Series 7: PD fat-sat · sagittal · 3.0mm · 0.29mm/px · 7 of 30 slices shown (2 of 3)]
[im 1/30]
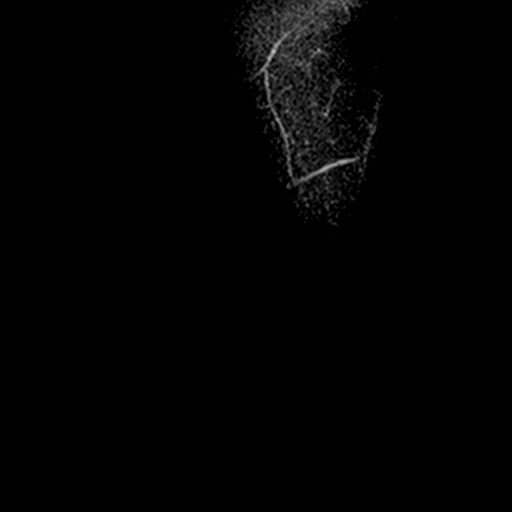
[im 5/30]
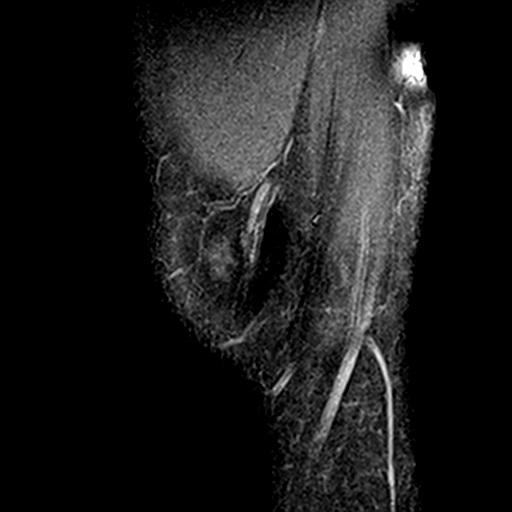
[im 10/30]
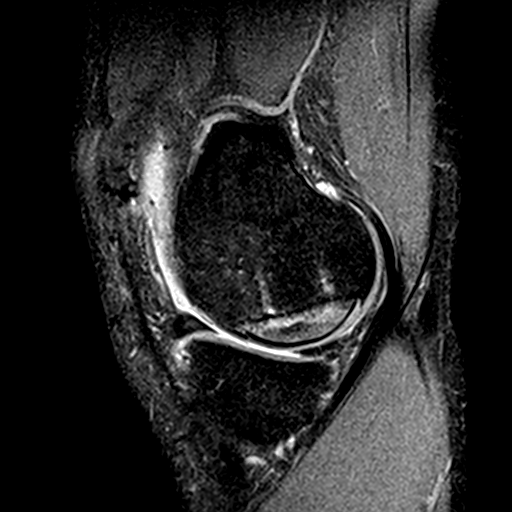
[im 15/30]
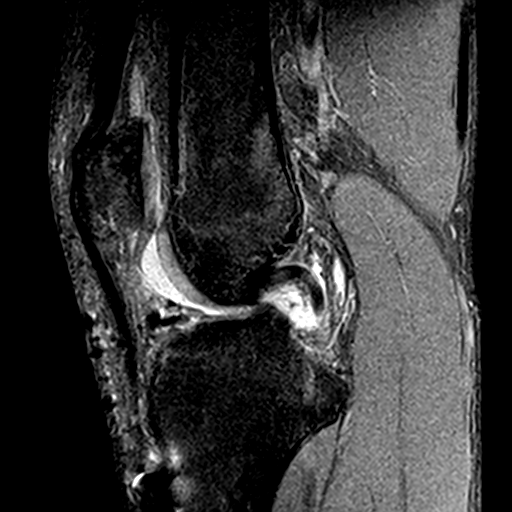
[im 20/30]
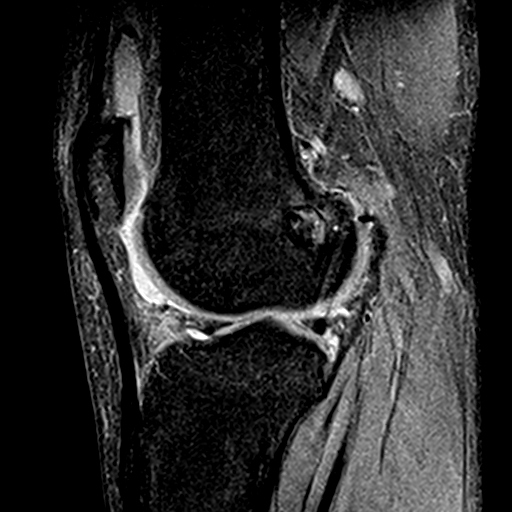
[im 25/30]
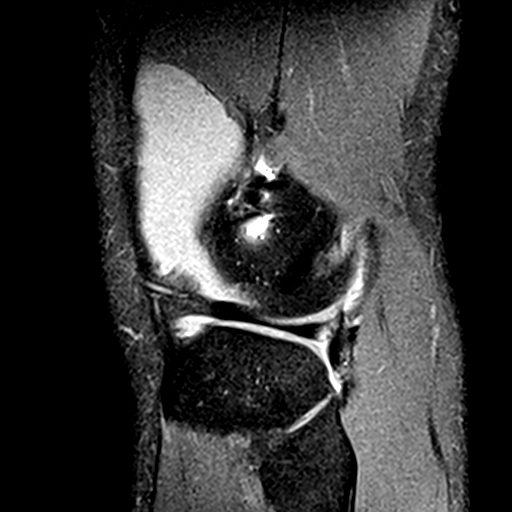
[im 30/30]
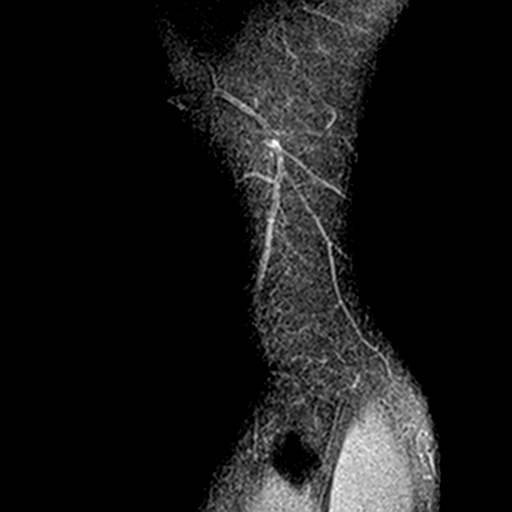

[Series 9: PD fat-sat · coronal · 2.0mm · 0.29mm/px · 3 of 11 slices shown (3 of 3)]
[im 1/11]
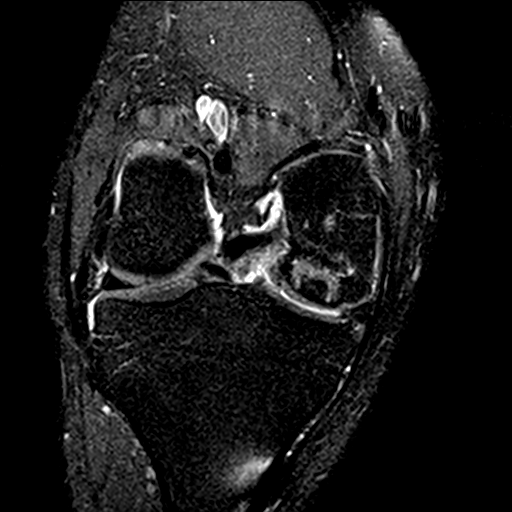
[im 6/11]
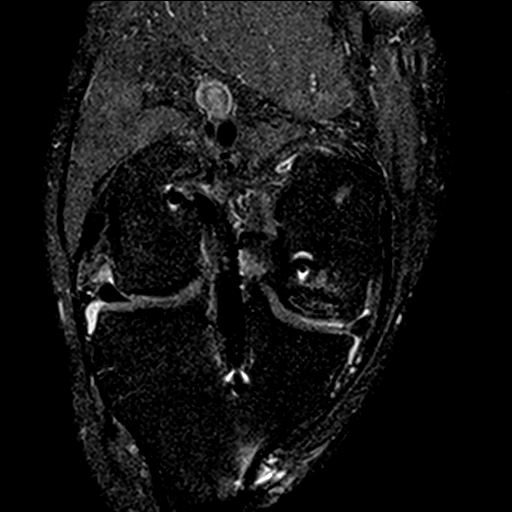
[im 11/11]
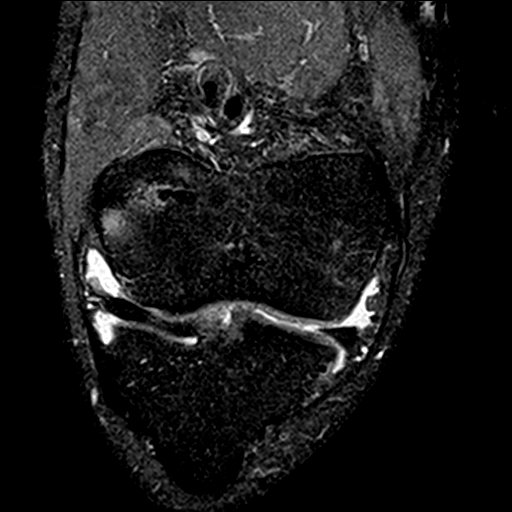

[21 of 40 positions shown; findings below may reference images not displayed]

FINDINGS: MENISCI

Medial meniscus: The patient has undergone repair of a highly
complex tear of the posterior horn of the medial meniscus since the
prior MRI. The central aspect of the posterior horn appears mildly
irregular and there is a small linear focus of intermediate
increased T2 signal reaching the meniscal undersurface in the
posterior horn consistent with postoperative change. Blunting along
the free edge of the body consistent with debridement is also
identified. No meniscal re-tear is seen.

Lateral meniscus:  Intact.

LIGAMENTS

Cruciates: Status post ACL grafting. The graft is intact with normal
signal. No evidence of graft impingement. Femoral and tibial tunnels
appear normal. The PCL is intact.

Collaterals:  Intact.

CARTILAGE

Patellofemoral:  Normal.

Medial: The patient is status post allograft placement along the
posterior and central weight-bearing medial femoral condyle. Mild
marrow edema is seen at the interface between the native femoral
condyle and the allograft consistent with postoperative change.
Given surgery 7-8 months ago, this is a normal finding. The
bone-bone interface appears congruent. Cartilage thickness at the
junction of the allograft and native cartilage also appears
congruent. There is a small focus of cartilage thinning along the
allograft measuring approximately 0.6 cm AP.

Lateral:  Preserved.

Joint:  Moderate joint effusion.

Popliteal Fossa:  No Baker's cyst.

Extensor Mechanism:  Intact.

Bones:  No fracture, contusion or worrisome lesion.

Other: None.
IMPRESSION: Status post debridement and repair of a complex medial meniscal tear
without evidence of complication. No meniscal re-tear is identified.

Status post ACL grafting. The graft is intact and normal in
appearance.

Status post allograft placement in the medial femoral condyle for an
osteochondral lesion. Only a small focus of cartilage thinning is
identified along the allograft. The graft appears to be
incorporating without evidence of complication

Moderate joint effusion.

## 2021-05-17 DIAGNOSIS — Z419 Encounter for procedure for purposes other than remedying health state, unspecified: Secondary | ICD-10-CM | POA: Diagnosis not present

## 2021-06-03 DIAGNOSIS — R21 Rash and other nonspecific skin eruption: Secondary | ICD-10-CM | POA: Diagnosis not present

## 2021-06-17 DIAGNOSIS — Z419 Encounter for procedure for purposes other than remedying health state, unspecified: Secondary | ICD-10-CM | POA: Diagnosis not present

## 2021-07-17 DIAGNOSIS — Z419 Encounter for procedure for purposes other than remedying health state, unspecified: Secondary | ICD-10-CM | POA: Diagnosis not present

## 2021-08-17 DIAGNOSIS — Z419 Encounter for procedure for purposes other than remedying health state, unspecified: Secondary | ICD-10-CM | POA: Diagnosis not present

## 2021-08-23 DIAGNOSIS — H65193 Other acute nonsuppurative otitis media, bilateral: Secondary | ICD-10-CM | POA: Diagnosis not present

## 2021-09-17 DIAGNOSIS — Z419 Encounter for procedure for purposes other than remedying health state, unspecified: Secondary | ICD-10-CM | POA: Diagnosis not present

## 2021-09-19 DIAGNOSIS — Z03818 Encounter for observation for suspected exposure to other biological agents ruled out: Secondary | ICD-10-CM | POA: Diagnosis not present

## 2021-09-19 DIAGNOSIS — J00 Acute nasopharyngitis [common cold]: Secondary | ICD-10-CM | POA: Diagnosis not present

## 2021-10-15 DIAGNOSIS — Z419 Encounter for procedure for purposes other than remedying health state, unspecified: Secondary | ICD-10-CM | POA: Diagnosis not present

## 2021-11-15 DIAGNOSIS — Z419 Encounter for procedure for purposes other than remedying health state, unspecified: Secondary | ICD-10-CM | POA: Diagnosis not present

## 2021-11-20 DIAGNOSIS — M7582 Other shoulder lesions, left shoulder: Secondary | ICD-10-CM | POA: Diagnosis not present

## 2021-12-15 DIAGNOSIS — Z419 Encounter for procedure for purposes other than remedying health state, unspecified: Secondary | ICD-10-CM | POA: Diagnosis not present

## 2022-01-15 DIAGNOSIS — Z419 Encounter for procedure for purposes other than remedying health state, unspecified: Secondary | ICD-10-CM | POA: Diagnosis not present

## 2022-02-14 DIAGNOSIS — Z419 Encounter for procedure for purposes other than remedying health state, unspecified: Secondary | ICD-10-CM | POA: Diagnosis not present

## 2022-03-17 DIAGNOSIS — Z419 Encounter for procedure for purposes other than remedying health state, unspecified: Secondary | ICD-10-CM | POA: Diagnosis not present

## 2022-04-17 DIAGNOSIS — Z419 Encounter for procedure for purposes other than remedying health state, unspecified: Secondary | ICD-10-CM | POA: Diagnosis not present

## 2022-05-17 DIAGNOSIS — Z419 Encounter for procedure for purposes other than remedying health state, unspecified: Secondary | ICD-10-CM | POA: Diagnosis not present

## 2022-06-17 DIAGNOSIS — Z419 Encounter for procedure for purposes other than remedying health state, unspecified: Secondary | ICD-10-CM | POA: Diagnosis not present

## 2022-07-17 DIAGNOSIS — Z419 Encounter for procedure for purposes other than remedying health state, unspecified: Secondary | ICD-10-CM | POA: Diagnosis not present

## 2022-08-17 DIAGNOSIS — Z419 Encounter for procedure for purposes other than remedying health state, unspecified: Secondary | ICD-10-CM | POA: Diagnosis not present

## 2022-09-17 DIAGNOSIS — Z419 Encounter for procedure for purposes other than remedying health state, unspecified: Secondary | ICD-10-CM | POA: Diagnosis not present

## 2022-10-16 DIAGNOSIS — Z419 Encounter for procedure for purposes other than remedying health state, unspecified: Secondary | ICD-10-CM | POA: Diagnosis not present

## 2022-11-16 DIAGNOSIS — Z419 Encounter for procedure for purposes other than remedying health state, unspecified: Secondary | ICD-10-CM | POA: Diagnosis not present

## 2022-12-16 DIAGNOSIS — Z419 Encounter for procedure for purposes other than remedying health state, unspecified: Secondary | ICD-10-CM | POA: Diagnosis not present

## 2023-01-16 DIAGNOSIS — Z419 Encounter for procedure for purposes other than remedying health state, unspecified: Secondary | ICD-10-CM | POA: Diagnosis not present

## 2023-02-15 DIAGNOSIS — Z419 Encounter for procedure for purposes other than remedying health state, unspecified: Secondary | ICD-10-CM | POA: Diagnosis not present

## 2024-02-01 ENCOUNTER — Ambulatory Visit
Admission: EM | Admit: 2024-02-01 | Discharge: 2024-02-01 | Disposition: A | Discharge status: Home / Self Care | Payer: Self-pay | Attending: Family Medicine | Admitting: Family Medicine

## 2024-02-01 DIAGNOSIS — R0789 Other chest pain: Secondary | ICD-10-CM

## 2024-02-01 NOTE — ED Provider Notes (Signed)
 [facility]  Note:  This document was prepared using Conservation officer, historic buildings and may include unintentional dictation errors.  MRN: [id number] DOB: [birth year]  Subjective:   Micheal Bentley is a 23 y.o. male presenting for 1 week history of persistent intermittent transient chest tightness, shortness of breath and atypical chest pain across the mid to left medial side.  No fever, sinus symptoms, cough, wheezing, nausea, vomiting, abdominal pain, rashes, palpitations, heart racing, diaphoresis.  No drug use of any kind.  No alcohol use.  No smoking of any kind including cigarettes, cigars, vaping, marijuana use.  No diabetes.  No family history of cardiac death, arrhythmias.  Patient believes that this might be related to caffeine supplements he has started to use recently.  Has stopped them in the past couple of days.  Has a remote history of asthma.  Last needed an inhaler when he was 23 years old.    No current facility-administered medications for this encounter.  Current Outpatient Medications:    diclofenac  (VOLTAREN ) 50 MG EC tablet, Take 1 tablet (50 mg total) by mouth 2 (two) times daily. (Patient not taking: Reported on 07/25/2019), Disp: 60 tablet, Rfl: 0   meloxicam  (MOBIC ) 15 MG tablet, Take 1 tablet (15 mg total) by mouth daily., Disp: 30 tablet, Rfl: 0   methocarbamol  (ROBAXIN ) 500 MG tablet, Take 1 tablet (500 mg total) by mouth every 8 (eight) hours as needed for muscle spasms. (Patient not taking: Reported on 07/25/2019), Disp: 30 tablet, Rfl: 0   No Known Allergies  Past Medical History:  Diagnosis Date   Asthma    Not an active issue   Seasonal allergies      Past Surgical History:  Procedure Laterality Date   ANTERIOR CRUCIATE LIGAMENT REPAIR Right 05/29/2019   Procedure: RIGHT KNEE RECONSTRUCTION ANTERIOR CRUCIATE LIGAMENT (ACL) WITH HAMSTRING AUTOGRAFT, MENISCAL REPAIR;  Surgeon: Jasmine Mesi, MD;  Location: [facility];  Service:  Orthopedics;  Laterality: Right;   NO PAST SURGERIES     OSTEOCHONDRAL DEFECT REPAIR/RECONSTRUCTION Right 07/11/2019   Procedure: right knee open osteochondral defect placement with bone  marrow aspiration;  Surgeon: Jasmine Mesi, MD;  Location: [facility];  Service: Orthopedics;  Laterality: Right;    Family History  Problem Relation Age of Onset   Diabetes Father     Social History   Tobacco Use   Smoking status: Never   Smokeless tobacco: Never  Vaping Use   Vaping status: Never Used  Substance Use Topics   Alcohol use: No   Drug use: No    ROS   Objective:   Vitals: BP 129/80 (BP Location: Left Arm)   Pulse 76   Resp 18   SpO2 96%   Physical Exam Constitutional:      General: He is not in acute distress.    Appearance: Normal appearance. He is well-developed. He is not ill-appearing, toxic-appearing or diaphoretic.  HENT:     Head: Normocephalic and atraumatic.     Right Ear: External ear normal.     Left Ear: External ear normal.     Nose: Nose normal.     Mouth/Throat:     Mouth: Mucous membranes are moist.   Eyes:     General: No scleral icterus.       Right eye: No discharge.        Left eye: No discharge.     Extraocular Movements: Extraocular movements intact.   Neck:  Vascular: Normal carotid pulses. No carotid bruit, hepatojugular reflux or JVD.   Cardiovascular:     Rate and Rhythm: Normal rate and regular rhythm.     Heart sounds: Normal heart sounds. No murmur heard.    No friction rub. No gallop.  Pulmonary:     Effort: Pulmonary effort is normal. No respiratory distress.     Breath sounds: Normal breath sounds. No stridor. No wheezing, rhonchi or rales.  Chest:     Chest wall: No tenderness.   Neurological:     Mental Status: He is alert and oriented to person, place, and time.   Psychiatric:     Comments: Anxious demeanor, pressured speech.    ED ECG REPORT   Date: 02/01/2024  EKG Time: 1:31 PM   Rate: 64bpm  Rhythm: normal sinus rhythm,  there are no previous tracings available for comparison  Axis: normal  Intervals:none  ST&T Change: none  Narrative Interpretation: Sinus rhythm at 64 bpm.  No previous EKG available for comparison.   Assessment and Plan :   PDMP not reviewed this encounter.  1. Atypical chest pain    No acute findings.  Minimal cardiac risk factors.  Advised that he avoid the use of caffeine supplements.  Recommend avoiding strenuous physical exercise until he can be further evaluated by his PCP.  Appointment is in 2 days.  Discussed possibility of this being a variant of his asthma, exercise-induced asthma.  Deferred imaging given clear cardiopulmonary exam, hemodynamically stable vital signs.  Counseled patient on potential for adverse effects with medications prescribed/recommended today, ER and return-to-clinic precautions discussed, patient verbalized understanding.    Adolph Hoop, [place] 02/01/24 1335

## 2024-02-01 NOTE — ED Triage Notes (Signed)
 Patient reports chest tightness and SOB that started last week. Patient reports he does a lot of cardio.

## 2024-02-02 ENCOUNTER — Emergency Department (HOSPITAL_COMMUNITY)
Admission: EM | Admit: 2024-02-02 | Discharge: 2024-02-02 | Disposition: A | Discharge status: Home / Self Care | Payer: Self-pay | Attending: Emergency Medicine | Admitting: Emergency Medicine

## 2024-02-02 ENCOUNTER — Emergency Department (HOSPITAL_COMMUNITY): Payer: Self-pay

## 2024-02-02 ENCOUNTER — Other Ambulatory Visit: Payer: Self-pay

## 2024-02-02 ENCOUNTER — Encounter (HOSPITAL_COMMUNITY): Payer: Self-pay

## 2024-02-02 DIAGNOSIS — J45909 Unspecified asthma, uncomplicated: Secondary | ICD-10-CM | POA: Insufficient documentation

## 2024-02-02 DIAGNOSIS — R0789 Other chest pain: Secondary | ICD-10-CM | POA: Insufficient documentation

## 2024-02-02 LAB — BASIC METABOLIC PANEL WITH GFR
Anion gap: 11 (ref 5–15)
BUN: 22 mg/dL — ABNORMAL HIGH (ref 6–20)
CO2: 26 mmol/L (ref 22–32)
Calcium: 9.6 mg/dL (ref 8.9–10.3)
Chloride: 102 mmol/L (ref 98–111)
Creatinine, Ser: 1.14 mg/dL (ref 0.61–1.24)
GFR, Estimated: >60 mL/min (ref >60)
Glucose, Bld: 154 mg/dL — ABNORMAL HIGH (ref 70–99)
Potassium: 4.2 mmol/L (ref 3.5–5.1)
Sodium: 139 mmol/L (ref 135–145)

## 2024-02-02 LAB — CBC
HCT: 48.1 % (ref 39.0–52.0)
Hemoglobin: 15.7 g/dL (ref 13.0–17.0)
MCH: 29.3 pg (ref 26.0–34.0)
MCHC: 32.6 g/dL (ref 30.0–36.0)
MCV: 89.7 fL (ref 80.0–100.0)
Platelets: 156 10*3/uL (ref 150–400)
RBC: 5.36 MIL/uL (ref 4.22–5.81)
RDW: 12.6 % (ref 11.5–15.5)
WBC: 6.6 10*3/uL (ref 4.0–10.5)
nRBC: 0 % (ref 0.0–0.2)

## 2024-02-02 LAB — TROPONIN I (HIGH SENSITIVITY): Troponin I (High Sensitivity): <2 ng/L (ref <18)

## 2024-02-02 NOTE — ED Triage Notes (Signed)
 Patient reports chest tightness hat started last week Thursday after doing cardio. Denies any chest tightness since. States sometimes feels tingling on his arms. Denies shob, dizziness or any other symptoms at this time. States he went to Urgent care and had ECG done which was ok. However, still wants to be evaluated by ED

## 2024-02-02 NOTE — ED Provider Notes (Signed)
 [place] EMERGENCY DEPARTMENT AT [facility] Provider Note   CSN: [id number] Arrival date & time: 02/02/24  0900     Patient presents with: No chief complaint on file.   Micheal Bentley is a 23 y.o. male.   The history is provided by the patient and medical records. No language interpreter was used.     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  Patient endorsed pain to the left side of his chest ongoing for nearly a week.  He described pain as a tightness pressure sensation in his left chest with occasional tingling sensation in his left arm.  Pain happens sporadically and comes and goes not related to exertion.  He does not endorse any fever chills no lightheadedness no dizziness no nausea no sweats and no significant shortness of breath.  He admits to exercise on a regular basis and initially thought it was related to heavy lifting and exercising so he is has slowed down his exercise but pain is still there.  He also admits to taking preworkout but has decided to stop taking it once his symptoms started.  He went to urgent care center yesterday for his complaint.  He states that his EKG was normal and he does have an appointment with a PCP tomorrow however because his pain is still there he decided to come to the ER.  Currently rates pain as 2 out of 10.  Patient denies alcohol or drug use.  He denies any hormone use.  No significant family history of heart disease no one with premature death from heart disease.  Remote history of childhood asthma.  Prior to Admission medications   Medication Sig Start Date End Date Taking? Authorizing Provider  diclofenac  (VOLTAREN ) 50 MG EC tablet Take 1 tablet (50 mg total) by mouth 2 (two) times daily. Patient not taking: Reported on 07/25/2019 07/12/19   Magnant, Justice Olp, PA-C  meloxicam  (MOBIC ) 15 MG tablet Take 1 tablet (15 mg total) by mouth daily. 01/24/20   Magnant,  Justice Olp, PA-C  methocarbamol  (ROBAXIN ) 500 MG tablet Take 1 tablet (500 mg total) by mouth every 8 (eight) hours as needed for muscle spasms. Patient not taking: Reported on 07/25/2019 07/12/19   Magnant, Justice Olp, PA-C    Allergies: Patient has no known allergies.    Review of Systems  All other systems reviewed and are negative.   Updated Vital Signs BP (!) 140/80 (BP Location: Right Arm)   Pulse 85   Temp 98 F (36.7 C) (Oral)   Resp 18   Ht 5' 8 (1.727 m)   Wt 78.7 kg   SpO2 100%   BMI 26.38 kg/m   Physical Exam Constitutional:      General: He is not in acute distress.    Appearance: He is well-developed.  HENT:     Head: Atraumatic.   Eyes:     Conjunctiva/sclera: Conjunctivae normal.    Cardiovascular:     Rate and Rhythm: Normal rate and regular rhythm.     Pulses: Normal pulses.     Heart sounds: Normal heart sounds.  Pulmonary:     Effort: Pulmonary effort is normal.     Breath sounds: Normal breath sounds.  Chest:     Chest wall: No tenderness.  Abdominal:     Palpations: Abdomen is soft.     Tenderness: There is no abdominal tenderness.   Musculoskeletal:     Cervical back: Normal  range of motion and neck supple.   Skin:    Findings: No rash.   Neurological:     Mental Status: He is alert.     (all labs ordered are listed, but only abnormal results are displayed) Labs Reviewed  BASIC METABOLIC PANEL WITH GFR - Abnormal; Notable for the following components:      Result Value   Glucose, Bld 154 (*)    BUN 22 (*)    All other components within normal limits  CBC  TROPONIN I (HIGH SENSITIVITY)  TROPONIN I (HIGH SENSITIVITY)     Date: 02/02/2024  Rate: 83  Rhythm: normal sinus rhythm  QRS Axis: normal  Intervals: normal  ST/T Wave abnormalities: normal  Conduction Disutrbances: none  Narrative Interpretation:   Old EKG Reviewed: No significant changes noted    Radiology: DG Chest Port 1 View Result Date:  02/02/2024 CLINICAL DATA:  Chest pain EXAM: PORTABLE CHEST 1 VIEW COMPARISON:  None Available. FINDINGS: No consolidation, pneumothorax or effusion. No edema. Normal cardiopericardial silhouette. IMPRESSION: No acute cardiopulmonary disease. Electronically Signed   By: Adrianna Horde M.D.   On: 02/02/2024 11:10     Procedures   Medications Ordered in the ED - No data to display                                  Medical Decision Making Amount and/or Complexity of Data Reviewed Labs: ordered. Radiology: ordered.   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  Patient endorsed pain to the left side of his chest ongoing for nearly a week.  He described pain as a tightness pressure sensation in his left chest with occasional tingling sensation in his left arm.  Pain happens sporadically and comes and goes not related to exertion.  He does not endorse any fever chills no lightheadedness no dizziness no nausea no sweats and no significant shortness of breath.  He admits to exercise on a regular basis and initially thought it was related to heavy lifting and exercising so he is has slowed down his exercise but pain is still there.  He also admits to taking preworkout but has decided to stop taking it once his symptoms started.  He went to urgent care center yesterday for his complaint.  He states that his EKG was normal and he does have an appointment with a PCP tomorrow however because his pain is still there he decided to come to the ER.  Currently rates pain as 2 out of 10.  Patient denies alcohol or drug use.  He denies any hormone use.  No significant family history of heart disease no one with premature death from heart disease.  Remote history of childhood asthma.  On  exam patient is resting comfortably appears to be in no acute discomfort.  Heart with normal rate rhythm, lungs clear abdomen soft nontender no reproducible chest wall tenderness he has intact radial pulse bilaterally.  Vital sign overall reassuring.  Blood pressure is 140/80.  He is afebrile no hypoxia.  He is not tachycardic.  Patient is PERC negative, doubt PE.  His  heart score is 0, low risk of MACE  -Labs ordered, independently viewed and interpreted by me.  Labs remarkable for reassuring labs -The patient was maintained on a cardiac monitor.  I personally viewed and interpreted the cardiac monitored which showed an underlying rhythm of: Normal sinus rhythm -Imaging independently viewed and interpreted by me and I agree with radiologist's interpretation.  Result remarkable for chest x-ray without acute abnormality -This patient presents to the ED for concern of chest pain, this involves an extensive number of treatment options, and is a complaint that carries with it a high risk of complications and morbidity.  The differential diagnosis includes muscle skeletal chest pain, costochondritis, GERD, gastritis, pneumonia, MI, PE, cellulitis -Co morbidities that complicate the patient evaluation includes none -Treatment includes reassurance -Reevaluation of the patient after these medicines showed that the patient improved -PCP office notes or outside notes reviewed -Escalation to admission/observation considered: patients feels much better, is comfortable with discharge, and will follow up with PCP -Prescription medication considered, patient comfortable with home medication including Tylenol  or ibuprofen  -Social Determinant of Health considered which includes none      Final diagnoses:  Atypical chest pain    ED Discharge Orders          Ordered    Ambulatory referral to Cardiology       Comments: If you have not heard from the Cardiology office within the next 72 hours please call  [phone].   02/02/24 1142               Debbra Fairy, PA-C 02/02/24 1142    Wynetta Heckle, MD 02/02/24 1359

## 2024-02-02 NOTE — Discharge Instructions (Addendum)
 You have been evaluated for your symptoms.  Fortunately no concerning findings were noted on today's exam.  Follow-up closely with your primary care doctor for further care.  Cardiology team will reach out to you for an outpatient follow-up as needed.

## 2024-02-03 ENCOUNTER — Ambulatory Visit (INDEPENDENT_AMBULATORY_CARE_PROVIDER_SITE_OTHER): Payer: Self-pay | Admitting: Family Medicine

## 2024-02-03 ENCOUNTER — Encounter: Payer: Self-pay | Admitting: Family Medicine

## 2024-02-03 VITALS — BP 120/68 | HR 88 | Temp 98.0°F | Ht 68.0 in | Wt 180.0 lb

## 2024-02-03 DIAGNOSIS — Z114 Encounter for screening for human immunodeficiency virus [HIV]: Secondary | ICD-10-CM

## 2024-02-03 DIAGNOSIS — E663 Overweight: Secondary | ICD-10-CM

## 2024-02-03 DIAGNOSIS — Z7689 Persons encountering health services in other specified circumstances: Secondary | ICD-10-CM

## 2024-02-03 DIAGNOSIS — Z1159 Encounter for screening for other viral diseases: Secondary | ICD-10-CM

## 2024-02-03 DIAGNOSIS — R079 Chest pain, unspecified: Secondary | ICD-10-CM

## 2024-02-03 DIAGNOSIS — K219 Gastro-esophageal reflux disease without esophagitis: Secondary | ICD-10-CM

## 2024-02-03 LAB — LIPID PANEL
Cholesterol: 157 mg/dL (ref 0–200)
HDL: 51.5 mg/dL (ref >39.00)
LDL Cholesterol: 92 mg/dL (ref 0–99)
NonHDL: 105.03
Total CHOL/HDL Ratio: 3
Triglycerides: 63 mg/dL (ref 0.0–149.0)
VLDL: 12.6 mg/dL (ref 0.0–40.0)

## 2024-02-03 LAB — TSH: TSH: 0.84 u[IU]/mL (ref 0.35–5.50)

## 2024-02-03 LAB — COMPREHENSIVE METABOLIC PANEL WITH GFR
ALT: 22 U/L (ref 0–53)
AST: 24 U/L (ref 0–37)
Albumin: 4.8 g/dL (ref 3.5–5.2)
Alkaline Phosphatase: 48 U/L (ref 39–117)
BUN: 19 mg/dL (ref 6–23)
CO2: 30 meq/L (ref 19–32)
Calcium: 9.9 mg/dL (ref 8.4–10.5)
Chloride: 102 meq/L (ref 96–112)
Creatinine, Ser: 0.91 mg/dL (ref 0.40–1.50)
GFR: 117.52 mL/min (ref >60.00)
Glucose, Bld: 97 mg/dL (ref 70–99)
Potassium: 4 meq/L (ref 3.5–5.1)
Sodium: 137 meq/L (ref 135–145)
Total Bilirubin: 1 mg/dL (ref 0.2–1.2)
Total Protein: 7.4 g/dL (ref 6.0–8.3)

## 2024-02-03 LAB — HEMOGLOBIN A1C: Hgb A1c MFr Bld: 5.6 % (ref 4.6–6.5)

## 2024-02-03 LAB — MAGNESIUM: Magnesium: 1.9 mg/dL (ref 1.5–2.5)

## 2024-02-03 NOTE — Progress Notes (Signed)
 New Patient Office Visit  Subjective   Patient ID: Micheal Bentley, male    DOB: 11-09-2000  Age: 23 y.o. MRN: [id number]  CC:  Chief Complaint  Patient presents with   Establish Care    Referrals.    HPI Micheal Bentley presents to establish care with new provider.   Patients previous primary care provider: [facility].   Specialist: None   Patient was seen at [facility] on 06/17 and yesterday at [facility] ED for atypical chest pain. EKG completed yesterday with normal sinus rhythm. Negative Troponin and CBC/BMP stable. Patient was referred to cardiology yesterday for further evaluation. He is complaining of left sided chest pain. Described as a tightness, pressure with occasional tingling sensation in his left arm. Pain happens sporadically and comes and goes not related to exertion. Denies any associated symptoms. Previously took a pre workout, but has stopped this when he started having chest pain.   Patient is requesting a referral to GI. He reports she is having GERD symptoms and feels like he has a lump in his throat. He has took Tums previously with no relief.   Outpatient Encounter Medications as of 02/03/2024  Medication Sig   [DISCONTINUED] diclofenac  (VOLTAREN ) 50 MG EC tablet Take 1 tablet (50 mg total) by mouth 2 (two) times daily. (Patient not taking: Reported on 07/25/2019)   [DISCONTINUED] meloxicam  (MOBIC ) 15 MG tablet Take 1 tablet (15 mg total) by mouth daily.   [DISCONTINUED] methocarbamol  (ROBAXIN ) 500 MG tablet Take 1 tablet (500 mg total) by mouth every 8 (eight) hours as needed for muscle spasms. (Patient not taking: Reported on 07/25/2019)   No facility-administered encounter medications on file as of 02/03/2024.    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   Procedure: right knee open osteochondral defect placement with bone  marrow aspiration;  Surgeon: Jasmine Mesi, MD;  Location: [facility];  Service: Orthopedics;  Laterality: Right;    Family History  Problem Relation Age of Onset   Diabetes Father     Social History   Socioeconomic History   Marital status: Single    Spouse name: Not on file   Number of children: 0   Years of education: Not on file   Highest education level: Bachelor's degree (e.g., BA, AB, BS)  Occupational History   Not on file  Tobacco Use   Smoking status: Never   Smokeless tobacco: Never  Vaping Use   Vaping status: Never Used  Substance and Sexual Activity   Alcohol use: No    Comment: Quit: this year   Drug use: Not Currently    Types: Marijuana    Comment: Quit: this year   Sexual activity: Yes    Birth control/protection: Condom  Other Topics Concern   Not on file  Social History Narrative   Not on file   Social Drivers of Health   Financial Resource Strain: Medium Risk (02/02/2024)   Overall Financial Resource Strain (CARDIA)  Difficulty of Paying Living Expenses: Somewhat hard  Food Insecurity: No Food Insecurity (02/02/2024)   Hunger Vital Sign    Worried About Running Out of Food in the Last Year: Never true    Ran Out of Food in the Last Year: Never true  Transportation Needs: No Transportation Needs (02/02/2024)   PRAPARE - Administrator, Civil Service (Medical): No    Lack of Transportation (Non-Medical): No  Physical Activity: Sufficiently Active (02/02/2024)   Exercise Vital Sign    Days of Exercise per Week: 5 days    Minutes of Exercise per  Session: 30 min  Stress: Stress Concern Present (02/02/2024)   [employer] of Occupational Health - Occupational Stress Questionnaire    Feeling of Stress: To some extent  Social Connections: Moderately Integrated (02/02/2024)   Social Connection and Isolation Panel    Frequency of Communication with Friends and Family: More than three times a week    Frequency of Social Gatherings with Friends and Family: More than three times a week    Attends Religious Services: More than 4 times per year    Active Member of [employer] or Organizations: Yes    Attends Banker Meetings: 1 to 4 times per year    Marital Status: Never married  Intimate Partner Violence: Not At Risk (02/03/2024)   Humiliation, Afraid, Rape, and Kick questionnaire    Fear of Current or Ex-Partner: No    Emotionally Abused: No    Physically Abused: No    Sexually Abused: No    ROS See HPI above    Objective  BP 120/68   Pulse 88   Temp 98 F (36.7 C) (Oral)   Ht 5' 8 (1.727 m)   Wt 180 lb (81.6 kg)   SpO2 96%   BMI 27.37 kg/m   Physical Exam Vitals reviewed.  Constitutional:      General: He is not in acute distress.    Appearance: Normal appearance. He is overweight. He is not ill-appearing, toxic-appearing or diaphoretic.  HENT:     Head: Normocephalic and atraumatic.   Eyes:     General:        Right eye: No discharge.        Left eye: No discharge.     Conjunctiva/sclera: Conjunctivae normal.    Cardiovascular:     Rate and Rhythm: Normal rate and regular rhythm.     Heart sounds: Normal heart sounds. No murmur heard.    No friction rub. No gallop.  Pulmonary:     Effort: Pulmonary effort is normal. No respiratory distress.     Breath sounds: Normal breath sounds.   Musculoskeletal:        General: Normal range of motion.   Skin:    General: Skin is warm and dry.   Neurological:     General: No focal deficit present.     Mental Status: He is alert and oriented to person,  place, and time. Mental status is at baseline.   Psychiatric:        Mood and Affect: Mood normal.        Behavior: Behavior normal.        Thought Content: Thought content normal.        Judgment: Judgment normal.       Assessment & Plan:  Chest pain, unspecified type -     Comprehensive metabolic panel with GFR -     TSH -     Magnesium  Overweight -     Comprehensive metabolic panel with GFR -     Hemoglobin A1c -     Lipid panel -     TSH -     Magnesium  Need for hepatitis C screening test -     Hepatitis C antibody  Encounter for screening for HIV -     HIV Antibody (routine testing w rflx)  Gastroesophageal reflux disease without esophagitis -     Ambulatory referral to Gastroenterology  Encounter to establish care   1.Review health maintenance: -Covid booster: Declines -HIV and Hep C: Order  -HPV vaccine: Request records from pediatrician  2.Ordered labs (CMP, TSH, Magnesium, A1c, Lipid panel-not fasting) based on chest pain and BMI-overweight.  3. Advised if he does not receive a phone call or letter from MyChart about scheduling cardiology appointment, send a MyChart message or call the office.  4. Placed a referral to GI for GERD symptoms. Please call the office or send a MyChart message if you do not receive a phone call or a MyChart message about appointment in 2 weeks.  Return in about 1 year (around 02/02/2025) for physical.   Destenee Guerry, NP

## 2024-02-03 NOTE — Patient Instructions (Addendum)
-  It was nice to meet you and look forward to taking care of you.  -Ordered labs. Office will call with results and will be available on MyChart.  -If you do not receive a phone call or letter from MyChart about scheduling cardiology appointment, send a MyChart message or call the office.  -Placed a referral to GI for GERD symptoms. Please call the office or send a MyChart message if you do not receive a phone call or a MyChart message about appointment in 2 weeks.  -Follow up in 1 year for a physical.

## 2024-02-04 ENCOUNTER — Other Ambulatory Visit: Payer: Self-pay

## 2024-02-04 DIAGNOSIS — R0789 Other chest pain: Secondary | ICD-10-CM

## 2024-02-04 LAB — HEPATITIS C ANTIBODY: Hepatitis C Ab: NONREACTIVE

## 2024-02-04 LAB — HIV ANTIBODY (ROUTINE TESTING W REFLEX): HIV 1&2 Ab, 4th Generation: NONREACTIVE

## 2024-02-07 ENCOUNTER — Ambulatory Visit: Payer: Self-pay | Admitting: Family Medicine

## 2024-02-16 DIAGNOSIS — R079 Chest pain, unspecified: Secondary | ICD-10-CM | POA: Diagnosis not present

## 2024-02-23 DIAGNOSIS — K219 Gastro-esophageal reflux disease without esophagitis: Secondary | ICD-10-CM | POA: Diagnosis not present

## 2024-02-23 DIAGNOSIS — R0789 Other chest pain: Secondary | ICD-10-CM | POA: Diagnosis not present

## 2024-03-14 ENCOUNTER — Ambulatory Visit
Admission: EM | Admit: 2024-03-14 | Discharge: 2024-03-14 | Disposition: A | Discharge status: Home / Self Care | Attending: Family Medicine | Admitting: Family Medicine

## 2024-03-14 DIAGNOSIS — M79605 Pain in left leg: Secondary | ICD-10-CM | POA: Diagnosis not present

## 2024-03-14 DIAGNOSIS — S81812A Laceration without foreign body, left lower leg, initial encounter: Secondary | ICD-10-CM

## 2024-03-14 NOTE — ED Triage Notes (Signed)
 Also, patient reports some left leg pain while walking.

## 2024-03-14 NOTE — Discharge Instructions (Signed)
WOUND CARE Please return in 10 days to have your stitches/staples removed or sooner if you have concerns.  Keep area clean and dry for 24 hours. Do not remove bandage, if applied.  After 24 hours, remove bandage and wash wound gently with mild soap and warm water. Reapply a new bandage after cleaning wound, if directed.  Continue daily cleansing with soap and water until stitches/staples are removed.  Do not apply any ointments or creams to the wound while stitches/staples are in place, as this may cause delayed healing.  Notify the office if you experience any of the following signs of infection: Swelling, redness, pus drainage, streaking, fever >101.0 F  Notify the office if you experience excessive bleeding that does not stop after 15-20 minutes of constant, firm pressure.  

## 2024-03-14 NOTE — ED Provider Notes (Signed)
 [facility] - URGENT CARE CENTER  Note:  This document was prepared using Conservation officer, historic buildings and may include unintentional dictation errors.  MRN: [id number] DOB: 28-Mar-2001  Subjective:   Micheal Bentley is a 23 y.o. male presenting for suffering a left leg laceration today. This was from accidentally puncturing it with a sharp knife.  Has kept the wound covered and clean.  Declines tdap.   No current facility-administered medications for this encounter.  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   Procedure: right knee open osteochondral defect placement with bone  marrow aspiration;  Surgeon: Addie Cordella Hamilton, MD;  Location: [facility];  Service: Orthopedics;  Laterality: Right;    Family History  Problem Relation Age of Onset   Diabetes Father     Social History   Tobacco Use   Smoking status: Never   Smokeless tobacco: Never  Vaping Use   Vaping status: Never Used  Substance Use Topics   Alcohol use: No    Comment: Quit: this year   Drug use: Not Currently    Types: Marijuana    Comment: Quit: this year    ROS   Objective:   Vitals: BP 112/72 (BP Location: Left Arm)   Pulse 74   Temp 98.5 F (36.9 C) (Oral)   Resp 20   SpO2 98%   Physical Exam Constitutional:      General: He is not in acute distress.    Appearance:  Normal appearance. He is well-developed and normal weight. He is not ill-appearing, toxic-appearing or diaphoretic.  HENT:     Head: Normocephalic and atraumatic.     Right Ear: External ear normal.     Left Ear: External ear normal.     Nose: Nose normal.     Mouth/Throat:     Pharynx: Oropharynx is clear.  Eyes:     General: No scleral icterus.       Right eye: No discharge.        Left eye: No discharge.     Extraocular Movements: Extraocular movements intact.  Cardiovascular:     Rate and Rhythm: Normal rate.  Pulmonary:     Effort: Pulmonary effort is normal.  Musculoskeletal:     Cervical back: Normal range of motion.       Legs:  Neurological:     Mental Status: He is alert and oriented to person, place, and time.  Psychiatric:        Mood and Affect: Mood normal.        Behavior: Behavior normal.        Thought Content: Thought content normal.        Judgment: Judgment normal.     PROCEDURE NOTE: laceration repair Verbal consent obtained from patient.  Local anesthesia with 3cc Lidocaine  2% with epinephrine .  Wound explored for tendon, ligament damage. Wound scrubbed  with soap and water and rinsed. Wound closed with #1 4-0 Ethilon (horizontal mattress) sutures.  Wound cleansed and dressed.   Assessment and Plan :   PDMP not reviewed this encounter.  1. Left leg pain   2. Leg laceration, left, initial encounter    Laceration repaired successfully. Wound care reviewed. Recommended Tylenol  and/or ibuprofen  for pain control. Return-to-clinic precautions discussed, patient verbalized understanding. Otherwise, follow up in 10 days for suture removal. Counseled patient on potential for adverse effects with medications prescribed/recommended today, ER and return-to-clinic precautions discussed, patient verbalized understanding.    Christopher Savannah, [place] 03/14/24 1820

## 2024-03-14 NOTE — ED Triage Notes (Signed)
 Last tetanus 01/2015.

## 2024-03-14 NOTE — ED Triage Notes (Signed)
 Patient present to urgent care for laceration on his left knee. Patient reports bumping into a sharp knife.  Tetanus is unknown. Bleeding is control with bandage.

## 2024-03-17 ENCOUNTER — Encounter: Payer: Self-pay | Admitting: Family Medicine

## 2024-03-17 ENCOUNTER — Ambulatory Visit (INDEPENDENT_AMBULATORY_CARE_PROVIDER_SITE_OTHER): Payer: Self-pay | Admitting: Family Medicine

## 2024-03-17 VITALS — BP 110/68 | HR 68 | Temp 97.7°F | Ht 68.0 in | Wt 181.6 lb

## 2024-03-17 DIAGNOSIS — Z0000 Encounter for general adult medical examination without abnormal findings: Secondary | ICD-10-CM | POA: Diagnosis not present

## 2024-03-17 DIAGNOSIS — K219 Gastro-esophageal reflux disease without esophagitis: Secondary | ICD-10-CM | POA: Diagnosis not present

## 2024-03-17 DIAGNOSIS — Z23 Encounter for immunization: Secondary | ICD-10-CM | POA: Diagnosis not present

## 2024-03-17 DIAGNOSIS — R82998 Other abnormal findings in urine: Secondary | ICD-10-CM

## 2024-03-17 LAB — POCT URINALYSIS DIPSTICK
Bilirubin, UA: NEGATIVE
Blood, UA: NEGATIVE
Glucose, UA: NEGATIVE
Ketones, UA: NEGATIVE
Leukocytes, UA: NEGATIVE
Nitrite, UA: NEGATIVE
Protein, UA: NEGATIVE
Spec Grav, UA: <=1.005 — AB (ref 1.010–1.025)
Urobilinogen, UA: 0.2 U/dL
pH, UA: 6 (ref 5.0–8.0)

## 2024-03-17 NOTE — Patient Instructions (Addendum)
 It was a pleasure seeing you today!!  -Physical completed today.  - Continue medication regimen. -Continue to follow-up with GI specialist and cardiologist specialist.  - Urinalysis done today due to urine being foamy. Normal except low specific gravity where it is more diluted from drinking a lot of water.  - Continue incorporating fruits and vegetables into your diet.  - Recommend participating in 150 minutes of exercise in a week.  -Follow up in 1 year for a physical.

## 2024-03-17 NOTE — Progress Notes (Signed)
 Complete physical exam  Patient: Micheal Bentley   DOB: [birth year]   23 y.o. Male  MRN: [id number]  Subjective:    Chief Complaint  Patient presents with   Annual Exam    Micheal Bentley is a 23 y.o. male who presents today for a complete physical exam. He reports consuming a general diet. The patient does not participate in regular exercise at present. He generally feels well. He reports sleeping well. He does have additional problems to discuss today.   Patient is interested in leaving urine for urinalysis today due to having urine that he describes as foamy in the last week or two.   Most recent fall risk assessment:    02/03/2024    1:14 PM  Fall Risk   Falls in the past year? 0  Number falls in past yr: 0  Injury with Fall? 0  Risk for fall due to : No Fall Risks  Follow up Falls evaluation completed     Most recent depression screenings:    02/03/2024    1:14 PM 02/03/2024    1:11 PM  PHQ 2/9 Scores  PHQ - 2 Score 0 0  PHQ- 9 Score 3     Vision:Not within last year  and Dental: No current dental problems and Receives regular dental care  Outpatient Medications Prior to Visit  Medication Sig   esomeprazole (NEXIUM) 40 MG capsule Take 40 mg by mouth every morning.   No facility-administered medications prior to visit.    Review of Systems  Respiratory:  Negative for shortness of breath.   Cardiovascular:  Positive for chest pain (random and occasional).  Gastrointestinal:  Negative for constipation, diarrhea, nausea and vomiting.  Neurological:  Negative for dizziness and headaches.      Objective:   BP 110/68   Pulse 68   Temp 97.7 F (36.5 C) (Oral)   Ht 5' 8 (1.727 m)   Wt 181 lb 9.6 oz (82.4 kg)   SpO2 99%   BMI 27.61 kg/m    Physical Exam Constitutional:      Appearance: Normal appearance.  HENT:     Head: Normocephalic and atraumatic.     Right Ear: Tympanic membrane, ear canal and external ear normal.     Left Ear: Tympanic  membrane, ear canal and external ear normal.     Nose: Nose normal.     Mouth/Throat:     Mouth: Mucous membranes are moist.     Pharynx: Oropharynx is clear. No oropharyngeal exudate or posterior oropharyngeal erythema.  Eyes:     Extraocular Movements: Extraocular movements intact.     Conjunctiva/sclera: Conjunctivae normal.  Cardiovascular:     Rate and Rhythm: Normal rate and regular rhythm.     Pulses: Normal pulses.     Heart sounds: Normal heart sounds.  Pulmonary:     Effort: Pulmonary effort is normal.     Breath sounds: Normal breath sounds.  Abdominal:     General: Abdomen is flat. Bowel sounds are normal.     Palpations: Abdomen is soft.  Musculoskeletal:        General: Normal range of motion.     Cervical back: Normal range of motion and neck supple.  Skin:    General: Skin is warm and dry.     Capillary Refill: Capillary refill takes less than 2 seconds.  Neurological:     Mental Status: He is alert and oriented to person, place, and time.  Psychiatric:  Mood and Affect: Mood normal.        Behavior: Behavior normal.        Thought Content: Thought content normal.        Judgment: Judgment normal.     Latest Reference Range & Units Most Recent  Bilirubin, UA  neg 03/17/24 13:59  Clarity, UA  Clear 03/17/24 13:59  Color, UA  YELLOW 03/17/24 13:59  Glucose Negative  Negative 03/17/24 13:59  Ketones, UA  neg 03/17/24 13:59  Leukocytes,UA Negative  Negative 03/17/24 13:59  Nitrite, UA  neg 03/17/24 13:59  pH, UA 5.0 - 8.0  6.0 03/17/24 13:59  Protein,UA Negative  Negative 03/17/24 13:59  Specific Gravity, UA 1.010 - 1.025  <=1.005 ! 03/17/24 13:59  Urobilinogen, UA 0.2 or 1.0 E.U./dL 0.2 08/24/72 86:40  RBC, UA  neg 03/17/24 13:59  !: Data is abnormal Assessment & Plan:    Routine Health Maintenance and Physical Exam  Immunization History  Administered Date(s) Administered   HPV 9-valent 10/05/2011, 12/03/2011, 08/22/2013   Hepatitis B 06-14-99,  02/19/1999, 09/24/1999   Tdap 01/28/2015    Health Maintenance  Topic Date Due   COVID-19 Vaccine (1 - 2024-25 season) Never done   INFLUENZA VACCINE  03/17/2024   DTaP/Tdap/Td (2 - Td or Tdap) 01/27/2025   Hepatitis B Vaccines  Completed   HPV VACCINES  Completed   Hepatitis C Screening  Completed   HIV Screening  Completed   Meningococcal B Vaccine  Aged Out    Discussed health benefits of physical activity, and encouraged him to engage in regular exercise appropriate for his age and condition.  Problem List Items Addressed This Visit   None Visit Diagnoses       Annual physical exam    -  Primary     Gastroesophageal reflux disease without esophagitis         Foamy urine       Relevant Orders   POC Urinalysis Dipstick (Completed)     Need for HPV vaccine         -Physical exam completed today.  - Continue medication regimen. -Continue to follow-up with GI specialist and cardiologist specialist.  - Urinalysis done today. Normal except low specific gravity where it is more diluted from drinking a lot of water.  - Continue incorporating fruits and vegetables into your diet.  - Recommend participating in 150 minutes of exercise in a week.   Return in about 1 year (around 03/17/2025).   JoAnna Williamson, NP

## 2024-03-22 ENCOUNTER — Ambulatory Visit: Admission: EM | Admit: 2024-03-22 | Discharge: 2024-03-22 | Disposition: A | Discharge status: Home / Self Care

## 2024-03-22 NOTE — ED Triage Notes (Signed)
 Pt presents for suture removal in left leg. Denies pain, swelling, redness, fever, chills, drainage.

## 2024-03-23 DIAGNOSIS — R072 Precordial pain: Secondary | ICD-10-CM | POA: Diagnosis not present

## 2024-03-31 ENCOUNTER — Ambulatory Visit: Payer: Self-pay | Admitting: Gastroenterology

## 2024-04-20 ENCOUNTER — Ambulatory Visit (INDEPENDENT_AMBULATORY_CARE_PROVIDER_SITE_OTHER): Payer: Self-pay | Admitting: Cardiology

## 2024-04-20 VITALS — BP 110/73 | HR 74 | Ht 68.0 in | Wt 185.0 lb

## 2024-04-20 DIAGNOSIS — K219 Gastro-esophageal reflux disease without esophagitis: Secondary | ICD-10-CM | POA: Diagnosis not present

## 2024-04-20 DIAGNOSIS — R072 Precordial pain: Secondary | ICD-10-CM | POA: Diagnosis not present

## 2024-04-20 MED ORDER — METOPROLOL TARTRATE 50 MG PO TABS: 50.0000 mg | ORAL_TABLET | ORAL | 0 refills | Status: AC

## 2024-04-20 NOTE — Progress Notes (Signed)
 Cardiology Office Note:  .   Date:  04/20/2024  ID:  Micheal Bentley, DOB [birth year], MRN [id number] PCP: Micheal Philippe SAUNDERS, NP  [facility] Providers Cardiologist:  None     History of Present Illness: .   Micheal Bentley is a 23 y.o. male Discussed the use of AI scribe software for clinical note transcription with the patient, who gave verbal consent to proceed.  History of Present Illness Micheal Bentley is a 23 year old male who presents with chest pain.  He experiences sharp left-sided chest pain that occurs with exhalation, initially noted during physical activities like running and cardio. The pain is severe enough to interrupt exercise and is described as a 'really sharp' sensation on the left side of his chest, occurring with each exhalation. A similar episode occurred while sitting in early August.  The sharp pain is not constant, but he experiences a dull, aching pain throughout the day, which is random and can occur at any time, whether sitting or standing. He describes this as 'uncomfortable' and 'extremely random'.  He has been compliant with his current medications, including Nexium 40 mg daily, which has alleviated throat discomfort. A stress echocardiogram on February 16, 2024, showed no ischemia and an ejection fraction of 55%. A prior EKG showed normal sinus rhythm with no ST segment changes. Another EKG from February 02, 2024, showed sinus rhythm at 83 bpm with no abnormalities.  He has a history of consuming 300-400 mg of caffeine six days a week, which he has since stopped. He is a former smoker and does not consume alcohol. He reports no fluid retention, palpitations, fevers, chills, night sweats, or shortness of breath. He feels his energy levels are good and has been active with weightlifting and regular cardio until recently.      Studies Reviewed: .        Results LABS LDL: 92 Creatinine: 0.9 A1c: 5.6 Hemoglobin: 15.7 Triglycerides:  63  DIAGNOSTIC Echocardiogram with stress test: No ischemia, EF 55% (02/16/2024) EKG: Sinus rhythm 83 bpm, no abnormalities (02/02/2024) Risk Assessment/Calculations:            Physical Exam:   VS:  BP 110/73 (BP Location: Right Arm, Patient Position: Sitting, Cuff Size: Normal)   Pulse 74   Ht 5' 8 (1.727 m)   Wt 185 lb (83.9 kg)   SpO2 99%   BMI 28.13 kg/m    Wt Readings from Last 3 Encounters:  04/20/24 185 lb (83.9 kg)  03/17/24 181 lb 9.6 oz (82.4 kg)  02/03/24 180 lb (81.6 kg)    GEN: Well nourished, well developed in no acute distress NECK: No JVD; No carotid bruits CARDIAC: RRR, no murmurs, no rubs, no gallops RESPIRATORY:  Clear to auscultation without rales, wheezing or rhonchi  ABDOMEN: Soft, non-tender, non-distended EXTREMITIES:  No edema; No deformity   ASSESSMENT AND PLAN: .    Assessment and Plan Assessment & Plan Chest pain, likely musculoskeletal or pleuritic Intermittent sharp chest pain with exhalation, likely musculoskeletal or pleuritic. Previous stress test and echocardiogram were reassuring, with no ischemia or significant cardiac issues. Symptoms improved but persist. Excessive caffeine may contribute but is not primary cause. - Order coronary CT to evaluate for coronary anomalies, pericardial effusions, or other abnormalities. - Continue Naprosyn or ibuprofen  as needed for symptom relief. - Follow up based on CT results.  Gastroesophageal reflux disease (GERD) GERD-like symptoms previously evaluated by GI, with Nexium providing relief. - Continue Nexium 40 mg daily.  Signed, Oneil Parchment, MD

## 2024-04-20 NOTE — Patient Instructions (Signed)
 Medication Instructions:  The current medical regimen is effective;  continue present plan and medications.  *If you need a refill on your cardiac medications before your next appointment, please call your pharmacy*  Testing/Procedures:   Your cardiac CT will be scheduled:   Micheal Bentley. [facility] [address]  [place], [place] [address] [phone]  If scheduled at the Heart [facility] V[facility] at [facility] street, please enter the parking lot using the [address] entrance and use the FREE valet service at the patient drop-off area. Enter the building and check-in with registration on the main floor.  Please follow these instructions carefully (unless otherwise directed):  An IV will be required for this test and Nitroglycerin will be given.  Hold all erectile dysfunction medications at least 3 days (72 hrs) prior to test. (Ie viagra, cialis, sildenafil, tadalafil, etc)   On the Night Before the Test: Be sure to Drink plenty of water. Do not consume any caffeinated/decaffeinated beverages or chocolate 12 hours prior to your test. Do not take any antihistamines 12 hours prior to your test.  On the Day of the Test: Drink plenty of water until 1 hour prior to the test. Do not eat any food 1 hour prior to test. You may take your regular medications prior to the test.  Take metoprolol  (Lopressor ) two hours prior to test. If you take Furosemide/Hydrochlorothiazide/Spironolactone/Chlorthalidone, please HOLD on the morning of the test. Patients who wear a continuous glucose monitor MUST remove the device prior to scanning.      After the Test: Drink plenty of water. After receiving IV contrast, you may experience a mild flushed feeling. This is normal. On occasion, you may experience a mild rash up to 24 hours after the test. This is not dangerous. If this occurs, you can take Benadryl 25 mg, Zyrtec, Claritin, or Allegra and increase your fluid intake.  (Patients taking Tikosyn should avoid Benadryl, and may take Zyrtec, Claritin, or Allegra) If you experience trouble breathing, this can be serious. If it is severe call 911 IMMEDIATELY. If it is mild, please call our office.  We will call to schedule your test 2-4 weeks out understanding that some insurance companies will need an authorization prior to the service being performed.   For more information and frequently asked questions, please visit our website : [email or web address]  For non-scheduling related questions, please contact the cardiac imaging nurse navigator should you have any questions/concerns: Cardiac Imaging Nurse Navigators Direct Office Dial: [phone]   For scheduling needs, including cancellations and rescheduling, please call [place], [phone].   Follow-Up: At [facility], you and your health needs are our priority.  As part of our continuing mission to provide you with exceptional heart care, our providers are all part of one team.  This team includes your primary Cardiologist (physician) and Advanced Practice Providers or APPs (Physician Assistants and Nurse Practitioners) who all work together to provide you with the care you need, when you need it.  Your next appointment:   Follow up will be based on the results of the above testing.   We recommend signing up for the patient portal called MyChart.  Sign up information is provided on this After Visit Summary.  MyChart is used to connect with patients for Virtual Visits (Telemedicine).  Patients are able to view lab/test results, encounter notes, upcoming appointments, etc.  Non-urgent messages can be sent to your provider as well.   To learn more about what you can  do with MyChart, go to [email or web address].

## 2024-05-05 DIAGNOSIS — R0981 Nasal congestion: Secondary | ICD-10-CM | POA: Diagnosis not present

## 2024-05-05 DIAGNOSIS — J029 Acute pharyngitis, unspecified: Secondary | ICD-10-CM | POA: Diagnosis not present

## 2024-05-05 DIAGNOSIS — Z03818 Encounter for observation for suspected exposure to other biological agents ruled out: Secondary | ICD-10-CM | POA: Diagnosis not present

## 2024-05-05 DIAGNOSIS — R519 Headache, unspecified: Secondary | ICD-10-CM | POA: Diagnosis not present

## 2024-05-09 ENCOUNTER — Encounter (HOSPITAL_COMMUNITY): Payer: Self-pay

## 2024-05-11 ENCOUNTER — Ambulatory Visit (HOSPITAL_COMMUNITY)

## 2024-05-25 ENCOUNTER — Ambulatory Visit (HOSPITAL_COMMUNITY)
Admission: RE | Admit: 2024-05-25 | Discharge: 2024-05-25 | Disposition: A | Discharge status: Home / Self Care | Source: Ambulatory Visit | Attending: Cardiology | Admitting: Cardiology

## 2024-05-25 DIAGNOSIS — R072 Precordial pain: Secondary | ICD-10-CM | POA: Diagnosis not present

## 2024-05-25 MED ORDER — IOHEXOL 350 MG/ML SOLN
100.0000 mL | Freq: Once | INTRAVENOUS | Status: AC | PRN
Start: 2024-05-25 — End: 2024-05-25
  Administered 2024-05-25: 100 mL via INTRAVENOUS

## 2024-05-25 MED ORDER — NITROGLYCERIN 0.4 MG SL SUBL
0.8000 mg | SUBLINGUAL_TABLET | Freq: Once | SUBLINGUAL | Status: AC
  Administered 2024-05-25: 0.8 mg via SUBLINGUAL

## 2024-05-26 ENCOUNTER — Ambulatory Visit: Payer: Self-pay | Admitting: Cardiology

## 2024-06-15 ENCOUNTER — Telehealth: Admitting: Physician Assistant

## 2024-06-15 DIAGNOSIS — L219 Seborrheic dermatitis, unspecified: Secondary | ICD-10-CM

## 2024-06-15 MED ORDER — TRIAMCINOLONE ACETONIDE 0.1 % EX CREA: 1.0000 | TOPICAL_CREAM | Freq: Two times a day (BID) | CUTANEOUS | 0 refills | Status: DC

## 2024-06-15 MED ORDER — KETOCONAZOLE 2 % EX SHAM: 1.0000 | MEDICATED_SHAMPOO | CUTANEOUS | 0 refills | Status: AC

## 2024-06-15 MED ORDER — TRIAMCINOLONE ACETONIDE 0.025 % EX OINT
1.0000 | TOPICAL_OINTMENT | Freq: Two times a day (BID) | CUTANEOUS | 0 refills | Status: AC
Start: 2024-06-15 — End: ?

## 2024-06-15 NOTE — Progress Notes (Signed)
 E Visit for Rash  We are sorry that you are not feeling well. Here is how we plan to help!  Based on what you have shared with me you are having a recurring rash of your scalp. Based on the picture you have shared, I question if you are dealing with a seborrheic dermatitis of the scalp. I would recommend a few things: First, make sure you are using an anti-dandruff shampoo over-the-counter like Head and Shoulders (shampoo with pyrithione zinc in it). I am going to prescribe a ketoconazole shampoo to use twice weekly as well. I have refilled the Triamcinolone to use over next 7-10 days to help this calm down. The hope is that use of the other shampoos will help to prevent this from recurring. I would recommend a follow-up with your PCP as well for ongoing management.  HOME CARE:  Take cool showers and avoid direct sunlight. Apply cool compress or wet dressings. Take a bath in an oatmeal bath.  Sprinkle content of one Aveeno packet under running faucet with comfortably warm water.  Bathe for 15-20 minutes, 1-2 times daily.  Pat dry with a towel. Do not rub the rash. Use hydrocortisone cream. Take an antihistamine like Benadryl for widespread rashes that itch.  The adult dose of Benadryl is 25-50 mg by mouth 4 times daily. Caution:  This type of medication may cause sleepiness.  Do not drink alcohol, drive, or operate dangerous machinery while taking antihistamines.  Do not take these medications if you have prostate enlargement.  Read package instructions thoroughly on all medications that you take.  GET HELP RIGHT AWAY IF:  Symptoms don't go away after treatment. Severe itching that persists. If you rash spreads or swells. If you rash begins to smell. If it blisters and opens or develops a yellow-brown crust. You develop a fever. You have a sore throat. You become short of breath.  MAKE SURE YOU:  Understand these instructions. Will watch your condition. Will get help right away if you  are not doing well or get worse.  Thank you for choosing an e-visit. Your e-visit answers were reviewed by a board certified advanced clinical practitioner to complete your personal care plan. Depending upon the condition, your plan could have included both over the counter or prescription medications. Please review your pharmacy choice. Be sure that the pharmacy you have chosen is open so that you can pick up your prescription now.  If there is a problem you may message your provider in MyChart to have the prescription routed to another pharmacy. Your safety is important to us . If you have drug allergies check your prescription carefully.  For the next 24 hours, you can use MyChart to ask questions about today's visit, request a non-urgent call back, or ask for a work or school excuse from your e-visit provider. You will get an email in the next two days asking about your experience. I hope that your e-visit has been valuable and will speed your recovery.  I have spent 5 minutes in review of e-visit questionnaire, review and updating patient chart, medical decision making and response to patient.   Elsie Velma Lunger, PA-C

## 2024-06-15 NOTE — Addendum Note (Signed)
 Addended by: GLADIS ELSIE BROCKS on: 06/15/2024 11:42 AM   Modules accepted: Orders
